# Patient Record
Sex: Female | Born: 1990 | Hispanic: Yes | Smoking: Never smoker
Health system: Southern US, Community
[De-identification: ages and names within clinical notes are randomized; demographics above are authoritative.]

## PROBLEM LIST (undated history)

## (undated) DIAGNOSIS — N939 Abnormal uterine and vaginal bleeding, unspecified: Secondary | ICD-10-CM

## (undated) DIAGNOSIS — Z789 Other specified health status: Secondary | ICD-10-CM

## (undated) DIAGNOSIS — O3110X0 Continuing pregnancy after spontaneous abortion of one fetus or more, unspecified trimester, not applicable or unspecified: Secondary | ICD-10-CM

## (undated) DIAGNOSIS — O4692 Antepartum hemorrhage, unspecified, second trimester: Secondary | ICD-10-CM

## (undated) DIAGNOSIS — O42919 Preterm premature rupture of membranes, unspecified as to length of time between rupture and onset of labor, unspecified trimester: Secondary | ICD-10-CM

## (undated) DIAGNOSIS — O0990 Supervision of high risk pregnancy, unspecified, unspecified trimester: Secondary | ICD-10-CM

## (undated) DIAGNOSIS — O34599 Maternal care for other abnormalities of gravid uterus, unspecified trimester: Secondary | ICD-10-CM

## (undated) DIAGNOSIS — N898 Other specified noninflammatory disorders of vagina: Secondary | ICD-10-CM

## (undated) DIAGNOSIS — O200 Threatened abortion: Secondary | ICD-10-CM

---
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
## 2011-08-10 DIAGNOSIS — O329XX0 Maternal care for malpresentation of fetus, unspecified, not applicable or unspecified: Secondary | ICD-10-CM

## 2018-11-14 NOTE — L&D Delivery Note (Addendum)
Patient called out and when RN arrived, head delivering, when MAU provider arrive delivery of non-viable female infant had occurred. I was called in. There was + heart rate > 100 and + fetal movement. Cord clamped x 2, cut. Baby wrapped in blanket and shown to mom and dad. Explained early nature of 20 wk fetus and incompatibility with life. Eyes were noted to be fused. She is well dated by 7 wk u/s. Given IV Pitocin and PR Cytotec 1000 mcg, and 20 u Pitocin injected into the cord. Awaiting delivery. It is unclear if they want autopsy or genetic testing Anesthesia: IV only

## 2019-06-06 ENCOUNTER — Inpatient Hospital Stay (HOSPITAL_COMMUNITY)
Admission: AD | Admit: 2019-06-06 | Discharge: 2019-06-06 | Disposition: A | Discharge status: Home / Self Care | Payer: Self-pay | Attending: Obstetrics & Gynecology | Admitting: Obstetrics & Gynecology

## 2019-06-06 ENCOUNTER — Inpatient Hospital Stay (HOSPITAL_COMMUNITY): Payer: Self-pay

## 2019-06-06 ENCOUNTER — Other Ambulatory Visit: Payer: Self-pay

## 2019-06-06 ENCOUNTER — Encounter (HOSPITAL_COMMUNITY): Payer: Self-pay

## 2019-06-06 DIAGNOSIS — N939 Abnormal uterine and vaginal bleeding, unspecified: Secondary | ICD-10-CM

## 2019-06-06 DIAGNOSIS — O3120X1 Continuing pregnancy after intrauterine death of one fetus or more, unspecified trimester, fetus 1: Secondary | ICD-10-CM

## 2019-06-06 DIAGNOSIS — O3121X1 Continuing pregnancy after intrauterine death of one fetus or more, first trimester, fetus 1: Secondary | ICD-10-CM

## 2019-06-06 DIAGNOSIS — Z3A01 Less than 8 weeks gestation of pregnancy: Secondary | ICD-10-CM | POA: Insufficient documentation

## 2019-06-06 DIAGNOSIS — N760 Acute vaginitis: Secondary | ICD-10-CM

## 2019-06-06 DIAGNOSIS — O209 Hemorrhage in early pregnancy, unspecified: Secondary | ICD-10-CM | POA: Insufficient documentation

## 2019-06-06 DIAGNOSIS — O23591 Infection of other part of genital tract in pregnancy, first trimester: Secondary | ICD-10-CM | POA: Insufficient documentation

## 2019-06-06 DIAGNOSIS — B9689 Other specified bacterial agents as the cause of diseases classified elsewhere: Secondary | ICD-10-CM | POA: Insufficient documentation

## 2019-06-06 DIAGNOSIS — O98811 Other maternal infectious and parasitic diseases complicating pregnancy, first trimester: Secondary | ICD-10-CM

## 2019-06-06 LAB — URINALYSIS, ROUTINE W REFLEX MICROSCOPIC
Bilirubin Urine: NEGATIVE
Glucose, UA: NEGATIVE mg/dL
Hgb urine dipstick: NEGATIVE
Ketones, ur: NEGATIVE mg/dL
Nitrite: POSITIVE — AB
Protein, ur: NEGATIVE mg/dL
Specific Gravity, Urine: 1.016 (ref 1.005–1.030)
pH: 6 (ref 5.0–8.0)

## 2019-06-06 LAB — CBC WITH DIFFERENTIAL/PLATELET
Abs Immature Granulocytes: 0.03 10*3/uL (ref 0.00–0.07)
Basophils Absolute: 0 10*3/uL (ref 0.0–0.1)
Basophils Relative: 0 %
Eosinophils Absolute: 0.1 10*3/uL (ref 0.0–0.5)
Eosinophils Relative: 2 %
HCT: 33.7 % — ABNORMAL LOW (ref 36.0–46.0)
Hemoglobin: 10.9 g/dL — ABNORMAL LOW (ref 12.0–15.0)
Immature Granulocytes: 0 %
Lymphocytes Relative: 21 %
Lymphs Abs: 1.7 10*3/uL (ref 0.7–4.0)
MCH: 26.9 pg (ref 26.0–34.0)
MCHC: 32.3 g/dL (ref 30.0–36.0)
MCV: 83.2 fL (ref 80.0–100.0)
Monocytes Absolute: 0.5 10*3/uL (ref 0.1–1.0)
Monocytes Relative: 6 %
Neutro Abs: 5.8 10*3/uL (ref 1.7–7.7)
Neutrophils Relative %: 71 %
Platelets: 259 10*3/uL (ref 150–400)
RBC: 4.05 MIL/uL (ref 3.87–5.11)
RDW: 14.2 % (ref 11.5–15.5)
WBC: 8.2 10*3/uL (ref 4.0–10.5)
nRBC: 0 % (ref 0.0–0.2)

## 2019-06-06 LAB — WET PREP, GENITAL
Sperm: NONE SEEN
Trich, Wet Prep: NONE SEEN
Yeast Wet Prep HPF POC: NONE SEEN

## 2019-06-06 LAB — HCG, QUANTITATIVE, PREGNANCY: hCG, Beta Chain, Quant, S: 84437 m[IU]/mL — ABNORMAL HIGH (ref <5)

## 2019-06-06 LAB — POCT PREGNANCY, URINE: Preg Test, Ur: POSITIVE — AB

## 2019-06-06 MED ORDER — METRONIDAZOLE 500 MG PO TABS: 500.0000 mg | ORAL_TABLET | Freq: Two times a day (BID) | ORAL | 0 refills | Status: DC

## 2019-06-06 MED ORDER — PREPLUS 27-1 MG PO TABS: 1.0000 | ORAL_TABLET | Freq: Every day | ORAL | 13 refills | Status: DC

## 2019-06-06 NOTE — MAU Note (Signed)
Pt c/o vaginal spotting yesterday after taking HPT a few days ago. Patient reports no pain.

## 2019-06-06 NOTE — MAU Provider Note (Signed)
History     CSN: [id number]  Arrival date and time: 06/06/19 1125   First Provider Initiated Contact with Patient 06/06/19 1223      Chief Complaint  Patient presents with  . Vaginal Bleeding   HPI Nichole Soto is a 28 y.o. G2P1001 at 9537w5d who presents to MAU with chief complaint of vaginal spotting in the setting of recent positive home pregnancy test. This is a new problem, onset yesterday. Patient endorses seeing blood on her toilet paper after voiding but denies ongoing vaginal bleeding. She denies abdominal pain, abdominal tenderness, dysuria, fever or recent illness.  OB History    Gravida  2   Para  1   Term  1   Preterm      AB      Living  1     SAB      TAB      Ectopic      Multiple      Live Births  1           Past Medical History:  Diagnosis Date  . Medical history non-contributory     Past Surgical History:  Procedure Laterality Date  . NO PAST SURGERIES      History reviewed. No pertinent family history.  Social History   Tobacco Use  . Smoking status: Not on file  Substance Use Topics  . Alcohol use: Not on file  . Drug use: Not on file    Allergies: No Known Allergies  No medications prior to admission.    Review of Systems  Constitutional: Negative for chills, fatigue and fever.  Respiratory: Negative for shortness of breath.   Gastrointestinal: Negative for abdominal pain.  Genitourinary: Positive for vaginal bleeding. Negative for difficulty urinating and dysuria.  Musculoskeletal: Negative for back pain.  Neurological: Negative for headaches.  All other systems reviewed and are negative.  Physical Exam   Blood pressure 115/60, pulse 77, temperature 98.6 F (37 C), temperature source Oral, resp. rate 18, weight 58.5 kg, last menstrual period 04/13/2019, SpO2 100 %.  Physical Exam  Nursing note and vitals reviewed. Constitutional: She is oriented to person, place, and time. She appears  well-developed and well-nourished.  Cardiovascular: Normal rate and normal pulses.  Respiratory: Effort normal.  GI: Soft. She exhibits no distension. There is no abdominal tenderness. There is no rebound, no guarding and no CVA tenderness.  Neurological: She is alert and oriented to person, place, and time.  Skin: Skin is warm and dry.  Psychiatric: She has a normal mood and affect. Her behavior is normal. Judgment and thought content normal.    MAU Course/MDM  Procedures  Patient Vitals for the past 24 hrs:  BP Temp Temp src Pulse Resp SpO2 Weight  06/06/19 1532 128/64 - - - - - -  06/06/19 1208 115/60 98.6 F (37 C) Oral 77 18 100 % -  06/06/19 1206 - - - - - - 58.5 kg    Results for orders placed or performed during the hospital encounter of 06/06/19 (from the past 24 hour(s))  Pregnancy, urine POC     Status: Abnormal   Collection Time: 06/06/19 11:56 AM  Result Value Ref Range   Preg Test, Ur POSITIVE (A) NEGATIVE  Urinalysis, Routine w reflex microscopic     Status: Abnormal   Collection Time: 06/06/19 12:14 PM  Result Value Ref Range   Color, Urine YELLOW YELLOW   APPearance HAZY (A) CLEAR   Specific Gravity, Urine  1.016 1.005 - 1.030   pH 6.0 5.0 - 8.0   Glucose, UA NEGATIVE NEGATIVE mg/dL   Hgb urine dipstick NEGATIVE NEGATIVE   Bilirubin Urine NEGATIVE NEGATIVE   Ketones, ur NEGATIVE NEGATIVE mg/dL   Protein, ur NEGATIVE NEGATIVE mg/dL   Nitrite POSITIVE (A) NEGATIVE   Leukocytes,Ua TRACE (A) NEGATIVE   RBC / HPF 0-5 0 - 5 RBC/hpf   WBC, UA 6-10 0 - 5 WBC/hpf   Bacteria, UA FEW (A) NONE SEEN   Squamous Epithelial / LPF 0-5 0 - 5   WBC Clumps PRESENT    Mucus PRESENT   CBC with Differential/Platelet     Status: Abnormal   Collection Time: 06/06/19 12:41 PM  Result Value Ref Range   WBC 8.2 4.0 - 10.5 K/uL   RBC 4.05 3.87 - 5.11 MIL/uL   Hemoglobin 10.9 (L) 12.0 - 15.0 g/dL   HCT 33.7 (L) 36.0 - 46.0 %   MCV 83.2 80.0 - 100.0 fL   MCH 26.9 26.0 - 34.0 pg    MCHC 32.3 30.0 - 36.0 g/dL   RDW 14.2 11.5 - 15.5 %   Platelets 259 150 - 400 K/uL   nRBC 0.0 0.0 - 0.2 %   Neutrophils Relative % 71 %   Neutro Abs 5.8 1.7 - 7.7 K/uL   Lymphocytes Relative 21 %   Lymphs Abs 1.7 0.7 - 4.0 K/uL   Monocytes Relative 6 %   Monocytes Absolute 0.5 0.1 - 1.0 K/uL   Eosinophils Relative 2 %   Eosinophils Absolute 0.1 0.0 - 0.5 K/uL   Basophils Relative 0 %   Basophils Absolute 0.0 0.0 - 0.1 K/uL   Immature Granulocytes 0 %   Abs Immature Granulocytes 0.03 0.00 - 0.07 K/uL  hCG, quantitative, pregnancy     Status: Abnormal   Collection Time: 06/06/19 12:41 PM  Result Value Ref Range   hCG, Beta Chain, Quant, S 84,437 (H) <5 mIU/mL  Wet prep, genital     Status: Abnormal   Collection Time: 06/06/19 12:43 PM   Specimen: Vaginal  Result Value Ref Range   Yeast Wet Prep HPF POC NONE SEEN NONE SEEN   Trich, Wet Prep NONE SEEN NONE SEEN   Clue Cells Wet Prep HPF POC PRESENT (A) NONE SEEN   WBC, Wet Prep HPF POC FEW (A) NONE SEEN   Sperm NONE SEEN    Meds ordered this encounter  Medications  . metroNIDAZOLE (FLAGYL) 500 MG tablet    Sig: Take 1 tablet (500 mg total) by mouth 2 (two) times daily.    Dispense:  14 tablet    Refill:  0    Order Specific Question:   Supervising Provider    Answer:   [place] Mode [[id number]]  . Prenatal Vit-Fe Fumarate-FA (PREPLUS) 27-1 MG TABS    Sig: Take 1 tablet by mouth daily.    Dispense:  30 tablet    Refill:  13    Order Specific Question:   Supervising Provider    Answer:   [place] Mode [[id number]]   US Ob Comp Addl Gest Less 14 Wks  Result Date: 06/06/2019 CLINICAL DATA:  New onset vaginal spotting yesterday, positive pregnancy test in MAU EXAM: TWIN OBSTETRIC <14WK [place] AND TRANSVAGINAL OB US COMPARISON:  None. FINDINGS: Number of IUPs:  2 Chorionicity/Amnionicity:  Dichorionic diamniotic TWIN 1  LEFT Yolk sac:  Present Embryo:  Present Cardiac Activity: Present Heart Rate: 146 bpm CRL:  11.4 mm   7  w 1 d                   US EDC: 01/22/2020 TWIN 2  RIGHT Markedly decreased size of gestational sac versus fetal pole size. Yolk sac:  Not visualized Embryo:  Present Cardiac Activity: Not visualized Heart Rate: N/A bpm CRL:  13.2 mm    w  d                  US EDC: Subchorionic hemorrhage:  None visualized. 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 Myometrium otherwise normal appearance. RIGHT ovary normal size and morphology, 2.1 x 2.0 x 3.2 cm. LEFT ovary normal size and morphology, 1.5 x 3.8 x 2.2 cm. No free pelvic fluid or adnexal masses. IMPRESSION: Twin intrauterine pregnancy. Twin 1 is live, located at LEFT cornua, EGA [redacted] weeks 1 day by crown-rump length. Twin 2 is nonviable, located at RIGHT cornua, with a fetal pole within a small contracted gestational sac and absent fetal cardiac activity. 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 Electronically Signed   By: Ulyses SouthwardMark  Boles M.D.   On: 06/06/2019 14:43   Assessment and Plan  --28 y.o. 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

## 2019-06-06 NOTE — MAU Note (Signed)
Still at registration desk with translator ( 2nd call)

## 2019-06-06 NOTE — Discharge Instructions (Signed)
Primer trimestre de embarazo  First Trimester of Pregnancy    El primer trimestre de embarazo se extiende desde la semana 1 hasta el final de la semana 13 (mes 1 al mes 3). Durante este tiempo, el bebé comenzará a desarrollarse dentro suyo. Entre la semana 6 y la 8, se forman los ojos y el rostro, y los latidos del corazón pueden escucharse en la ecografía. Al final de las 12 semanas, todos los órganos del bebé están formados. El cuidado prenatal es toda la asistencia médica que usted recibe antes del nacimiento del bebé. Asegúrese de recibir un buen cuidado prenatal y de seguir todas las indicaciones del médico.  Siga estas indicaciones en su casa:  Medicamentos  · Tome los medicamentos de venta libre y los recetados solamente como se lo haya indicado el médico. Algunos medicamentos son seguros para tomar durante el embarazo y otros no lo son.  · Tome vitaminas prenatales que contengan por lo menos 600 microgramos (?g) de ácido fólico.  · Si tiene problemas para defecar (estreñimiento), tome un medicamento que ablanda la materia fecal (laxante), siempre que lo autorice el médico.  Comida y bebida    · Ingiera alimentos saludables de manera regular.  · El médico le indicará la cantidad de peso que puede aumentar.  · No coma carne cruda ni quesos sin cocinar.  · Si tiene malestar estomacal (náuseas) o vomita (vómitos):  ? Ingiera 4 o 5 comidas pequeñas por día en lugar de 3 abundantes.  ? Intente comer algunas galletitas saladas.  ? Beba líquidos entre las comidas, en lugar de hacerlo durante estas.  · Para evitar el estreñimiento:  ? Consuma alimentos ricos en fibra, como frutas y verduras frescas, cereales integrales y legumbres.  ? Beba suficiente líquido para mantener el pis (orina) claro o de color amarillo pálido.  Actividad  · Haga ejercicios solamente como se lo haya indicado el médico. Deje de hacer ejercicios si tiene cólicos o dolor en la parte baja del vientre (abdomen) o en la cintura.  · No haga  actividad física si el clima está demasiado caluroso o húmedo, o si se encuentra en un lugar muy alto (altitud elevada).  · Intente no estar de pie durante mucho tiempo. Mueva las piernas con frecuencia si debe estar de pie en un lugar durante mucho tiempo.  · Evite levantar pesos excesivos.  · Use zapatos con tacones bajos. Mantenga una buena postura al sentarse y pararse.  · Puede tener relaciones sexuales, a menos que el médico le indique lo contrario.  Alivio del dolor y del malestar  · Use un sostén que le brinde buen soporte si le duelen las mamas.  · Dese baños de asiento con agua tibia para aliviar el dolor o las molestias causadas por las hemorroides. Use una crema antihemorroidal si el médico se lo permite.  · Descanse con las piernas elevadas si tiene calambres o dolor de cintura.  · Si tiene las venas de las piernas hinchadas y abultadas (venas varicosas):  ? Use medias elásticas de soporte o medias de compresión como se lo haya indicado el médico.  ? Levante (eleve) los pies durante 15 minutos, 3 o 4 veces por día.  ? Limite la sal en sus alimentos.  Cuidado prenatal  · Programe las visitas prenatales para la semana 12 de embarazo.  · Escriba sus preguntas. Llévelas cuando concurra a las visitas prenatales.  · Concurra a todas las visitas prenatales como se lo haya indicado el médico. Esto es importante.  Seguridad  · Use el cinturón de seguridad en todo   momento mientras conduce.  · Haga una lista con los números de teléfono en caso de emergencia. Esta lista debe incluir los números de los familiares, los amigos, el hospital y los departamentos de policía y de bomberos.  Instrucciones generales  · Pídale al médico que la derive a clases prenatales en su localidad. Debe comenzar a tomar las clases antes de entrar en el mes 6 de embarazo.  · Pida ayuda si necesita asesoramiento o asistencia con la alimentación. El médico puede aconsejarla o indicarle dónde recurrir para recibir ayuda.  · No se dé baños de  inmersión en agua caliente, baños turcos ni saunas.  · No se haga duchas vaginales ni use tampones o toallas higiénicas perfumadas.  · No mantenga las piernas cruzadas durante mucho tiempo.  · Evite las hierbas y el alcohol. Evite los fármacos que el médico no haya autorizado.  · No consuma ningún producto que contenga tabaco, lo que incluye cigarrillos, tabaco de mascar o cigarrillos electrónicos. Si necesita ayuda para dejar de fumar, consulte al médico. Puede recibir asesoramiento u otro tipo de apoyo para dejar de fumar.  · Evite el contacto con las bandejas sanitarias de los gatos y la tierra que estos animales usan. Estos elementos contienen gérmenes que pueden causar defectos congénitos al bebé y la posible pérdida del feto (aborto espontáneo) o muerte fetal.  · Visite al dentista. En su casa, lávese los dientes con un cepillo dental suave. Pásese el hilo dental con suavidad.  Comuníquese con un médico si:  · Tiene mareos.  · Tiene cólicos leves o siente presión en la parte baja del vientre.  · Sufre un dolor persistente en el abdomen.  · Sigue teniendo malestar estomacal, vomita o la materia fecal es líquida (diarrea).  · Nota una secreción de líquido con olor fétido que proviene de la vagina.  · Tiene dolor al hacer pis (orinar).  · Tiene el rostro, las manos, las piernas o los tobillos más hinchados (inflamados).  Solicite ayuda de inmediato si:  · Tiene fiebre.  · Tiene una pérdida de líquido por la vagina.  · Tiene sangrado o pequeñas pérdidas vaginales.  · Tiene cólicos o dolor muy intensos en el vientre.  · Sube o baja de peso rápidamente.  · Vomita sangre. Esto tiene un aspecto similar a la borra del café.  · Está en contacto con personas que tienen rubéola, la quinta enfermedad o varicela.  · Siente un dolor de cabeza muy intenso.  · Le falta el aire.  · Sufre cualquier tipo de traumatismo, por ejemplo, debido a una caída o un accidente automovilístico.  Resumen  · El primer trimestre de embarazo se  extiende desde la semana 1 hasta el final de la semana 13 (mes 1 al mes 3).  · Para cuidar su salud y la del bebé en gestación, necesitará consumir alimentos saludables, tomar medicamentos solamente si lo autoriza el médico, y hacer actividades que sean seguras para usted y para su bebé.  · Concurra a todas las visitas de control como se lo haya indicado el médico. Esto es importante porque el médico deberá asegurar que el bebé esté saludable y esté creciendo bien.  Esta información no tiene como fin reemplazar el consejo del médico. Asegúrese de hacerle al médico cualquier pregunta que tenga.  Document Released: 01/27/2009 Document Revised: 06/06/2017 Document Reviewed: 06/06/2017  Elsevier Patient Education © 2020 [employer]

## 2019-06-07 LAB — GC/CHLAMYDIA PROBE AMP (~~LOC~~) NOT AT ARMC
Chlamydia: NEGATIVE
Neisseria Gonorrhea: NEGATIVE

## 2019-06-23 ENCOUNTER — Encounter (HOSPITAL_COMMUNITY): Payer: Self-pay | Admitting: *Deleted

## 2019-06-23 ENCOUNTER — Inpatient Hospital Stay (HOSPITAL_COMMUNITY): Payer: Self-pay

## 2019-06-23 ENCOUNTER — Inpatient Hospital Stay (HOSPITAL_COMMUNITY)
Admission: AD | Admit: 2019-06-23 | Discharge: 2019-06-23 | Disposition: A | Discharge status: Home / Self Care | Payer: Self-pay | Attending: Obstetrics and Gynecology | Admitting: Obstetrics and Gynecology

## 2019-06-23 ENCOUNTER — Other Ambulatory Visit: Payer: Self-pay

## 2019-06-23 DIAGNOSIS — O468X1 Other antepartum hemorrhage, first trimester: Secondary | ICD-10-CM

## 2019-06-23 DIAGNOSIS — O208 Other hemorrhage in early pregnancy: Secondary | ICD-10-CM | POA: Insufficient documentation

## 2019-06-23 DIAGNOSIS — O209 Hemorrhage in early pregnancy, unspecified: Secondary | ICD-10-CM

## 2019-06-23 DIAGNOSIS — O30041 Twin pregnancy, dichorionic/diamniotic, first trimester: Secondary | ICD-10-CM | POA: Insufficient documentation

## 2019-06-23 DIAGNOSIS — Z3A10 10 weeks gestation of pregnancy: Secondary | ICD-10-CM | POA: Insufficient documentation

## 2019-06-23 DIAGNOSIS — O3121X2 Continuing pregnancy after intrauterine death of one fetus or more, first trimester, fetus 2: Secondary | ICD-10-CM

## 2019-06-23 DIAGNOSIS — O418X11 Other specified disorders of amniotic fluid and membranes, first trimester, fetus 1: Secondary | ICD-10-CM

## 2019-06-23 LAB — URINALYSIS, ROUTINE W REFLEX MICROSCOPIC
Bacteria, UA: NONE SEEN
Bilirubin Urine: NEGATIVE
Glucose, UA: NEGATIVE mg/dL
Hgb urine dipstick: NEGATIVE
Ketones, ur: NEGATIVE mg/dL
Nitrite: NEGATIVE
Protein, ur: NEGATIVE mg/dL
Specific Gravity, Urine: 1.025 (ref 1.005–1.030)
pH: 6 (ref 5.0–8.0)

## 2019-06-23 LAB — ABO/RH: ABO/RH(D): O POS

## 2019-06-23 NOTE — Discharge Instructions (Signed)
Hematoma subcoriónico °Subchorionic Hematoma ° °Un hematoma subcoriónico es una acumulación de sangre entre la pared externa del embrión (corion) y la pared interna de la matriz (útero). °Esta afección puede causar hemorragia vaginal. Si causan poca o nada de hemorragia vaginal, generalmente, los hematomas pequeños que ocurren al principio del embarazo se reducen por su propia cuenta y no afectan al bebé ni al embarazo. Cuando la hemorragia comienza más tarde en el embarazo, o el hematoma es más grande o se produce en una paciente de edad avanzada, la afección puede ser más grave. Los hematomas más grandes pueden agrandarse aún más, lo que aumenta las posibilidades de aborto espontáneo. Esta afección también aumenta los siguientes riesgos: °· Separación prematura de la placenta del útero. °· Parto antes de término (prematuro). °· Muerte fetal. °¿Cuáles son las causas? °Se desconoce la causa exacta de esta afección. Ocurre cuando la sangre queda atrapada entre la placenta y la pared uterina porque la placenta se ha separado del lugar original del implante. °¿Qué incrementa el riesgo? °Es más probable que desarrolle esta afección si: °· Recibió tratamiento con medicamentos para la fertilidad. °· La concepción se realizó a través de la fertilización in vitro (FIV). °¿Cuáles son los signos o los síntomas? °Los síntomas de esta afección incluyen los siguientes: °· Pérdida o hemorragia vaginal. °· Contracciones del útero. Estas contracciones provocan dolor abdominal. °En ocasiones, puede no haber síntomas y la hemorragia solo se puede ver cuando se toman imágenes ecográficas (ecografía transvaginal). °¿Cómo se diagnostica? °Esta afección se diagnostica con un examen físico. Es un examen pélvico. También pueden hacerle otros estudios, por ejemplo: °· Análisis de sangre. °· Análisis de orina. °· Ecografía del abdomen. °¿Cómo se trata? °El tratamiento de esta afección puede variar. El tratamiento puede incluir lo  siguiente: °· Observación cautelosa. La observarán atentamente para detectar cualquier cambio en la hemorragia. Durante esta etapa: °? El hematoma puede reabsorberse en el cuerpo. °? El hematoma puede separar el espacio lleno de líquido que contiene al embrión (saco gestacional) de la pared del útero (endometrio). °· Medicamentos. °· Restricción de las actividades. Puede ser necesaria hasta que se detenga la hemorragia. °Siga estas indicaciones en su casa: °· Haga reposo en cama si se lo indica el médico. °· No levante ningún objeto que pese más de 10 libras (4,5 kg) o siga las indicaciones del médico. °· No consuma ningún producto que contenga nicotina o tabaco, como cigarrillos y cigarrillos electrónicos. Si necesita ayuda para dejar de fumar, consulte al médico. °· Lleve un registro escrito de la cantidad de toallas higiénicas que utiliza cada día y cuán empapadas (saturadas) están. °· No use tampones. °· Concurra a todas las visitas de control como se lo haya indicado el médico. Esto es importante. El profesional podrá pedirle que se realice análisis de seguimiento, ecografías o ambas. °Comuníquese con un médico si: °· Tiene una hemorragia vaginal. °· Tiene fiebre. °Solicite ayuda de inmediato si: °· Siente calambres intensos en el estómago, en la espalda, en el abdomen o en la pelvis. °· Elimina coágulos o tejidos grandes. Guarde los tejidos para que su médico los vea. °· Tiene más hemorragia vaginal, y se desmaya o se siente mareada o débil. °Resumen °· Un hematoma subcoriónico es una acumulación de sangre entre la pared externa de la placenta y el útero. °· Esta afección puede causar hemorragia vaginal. °· En ocasiones, puede no haber síntomas y la hemorragia solo se puede ver cuando se toman imágenes ecográficas. °· El tratamiento puede incluir una observación cautelosa, medicamentos   o restricción de las actividades. °Esta información no tiene como fin reemplazar el consejo del médico. Asegúrese de hacerle  al médico cualquier pregunta que tenga. °Document Released: 02/16/2009 Document Revised: 08/10/2017 Document Reviewed: 08/10/2017 °Elsevier Patient Education © 2020 [employer]vier Inc. ° °

## 2019-06-23 NOTE — MAU Note (Signed)
Nichole Soto is a 28 y.o. at [redacted]w[redacted]d here in MAU reporting: is here to see if babies are okay, states with previous u/s it showed 1 baby was okay but they other may not be. Is having some spotting but no pain. Has been spotting since last visit. No abnormal discharge.  Onset of complaint: ongoing  Pain score: 0/10  Vitals:   06/23/19 1231  BP: 105/63  Pulse: 64  Resp: 16  Temp: 98.6 F (37 C)  SpO2: 100%      Lab orders placed from triage: UA

## 2019-06-23 NOTE — MAU Provider Note (Signed)
Chief Complaint: Vaginal Bleeding   First Provider Initiated Contact with Patient 06/23/19 1257     *Spanish interpreter used for this visit*  SUBJECTIVE HPI: Nichole Soto is a 28 y.o. G2P1001 at 8020w1d who presents to Maternity Admissions reporting vaginal bleeding. Has had consistent pink spotting since her last MAU visit. Only sees bleeding when she wipes. Denies abdominal pain, vaginal discharge, or dysuria. Has not started prenatal care yet. IUP was confirmed during last MAU visit. Ultrasound showed twin gestation, 1 live & 1 non viable. 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    Past Medical History:  Diagnosis Date  . Medical history non-contributory    OB History  Gravida Para Term Preterm AB Living  2 1 1     1   SAB TAB Ectopic Multiple Live Births          1    # Outcome Date GA Lbr Len/2nd Weight Sex Delivery Anes PTL Lv  2 Current           1 Term 08/10/11    Wandalee FerdinandM CS-LTranv   LIV   Past Surgical History:  Procedure Laterality Date  . CESAREAN SECTION     Social History   Socioeconomic History  . Marital status: Significant Other    Spouse name: Not on file  . Number of children: Not on file  . Years of education: Not on file  . Highest education level: Not on file  Occupational History  . Not on file  Social Needs  . Financial resource strain: Not on file  . Food insecurity    Worry: Not on file    Inability: Not on file  . Transportation needs    Medical: Not on file    Non-medical: Not on file  Tobacco Use  . Smoking status: Never Smoker  . Smokeless tobacco: Never Used  Substance and Sexual Activity  . Alcohol use: Not Currently  . Drug use: Never  . Sexual activity: Yes    Birth control/protection: None  Lifestyle  . Physical activity    Days per week: Not on file    Minutes per session: Not on file  . Stress: Not on file  Relationships  . Social Musicianconnections    Talks on phone: Not on file    Gets together: Not on  file    Attends religious service: Not on file    Active member of club or organization: Not on file    Attends meetings of clubs or organizations: Not on file    Relationship status: Not on file  . Intimate partner violence    Fear of current or ex partner: Not on file    Emotionally abused: Not on file    Physically abused: Not on file    Forced sexual activity: Not on file  Other Topics Concern  . Not on file  Social History Narrative  . Not on file   History reviewed. No pertinent family history. No current facility-administered medications on file prior to encounter.    Current Outpatient Medications on File Prior to Encounter  Medication Sig Dispense Refill  . Prenatal Vit-Fe Fumarate-FA (PREPLUS) 27-1 MG TABS Take 1 tablet by mouth daily. 30 tablet 13   No Known Allergies  I have reviewed patient's Past Medical Hx, Surgical Hx, Family Hx, Social Hx, medications and allergies.   Review of Systems  Constitutional: Negative.   Gastrointestinal: Negative.   Genitourinary: Positive for vaginal bleeding. Negative for dysuria  and vaginal discharge.    OBJECTIVE Patient Vitals for the past 24 hrs:  BP Temp Temp src Pulse Resp SpO2 Height Weight  06/23/19 1539 103/65 - - 65 16 100 % - -  06/23/19 1231 105/63 98.6 F (37 C) Oral 64 16 100 % - -  06/23/19 1228 - - - - - - 4' 10.5" (1.486 m) 56.8 kg   Constitutional: Well-developed, well-nourished female in no acute distress.  Cardiovascular: normal rate & rhythm, no murmur Respiratory: normal rate and effort. Lung sounds clear throughout GI: Abd soft, non-tender, Pos BS x 4. No guarding or rebound tenderness MS: Extremities nontender, no edema, normal ROM Neurologic: Alert and oriented x 4.      LAB RESULTS Results for orders placed or performed during the hospital encounter of 06/23/19 (from the past 24 hour(s))  Urinalysis, Routine w reflex microscopic     Status: Abnormal   Collection Time: 06/23/19 12:32 PM  Result  Value Ref Range   Color, Urine AMBER (A) YELLOW   APPearance HAZY (A) CLEAR   Specific Gravity, Urine 1.025 1.005 - 1.030   pH 6.0 5.0 - 8.0   Glucose, UA NEGATIVE NEGATIVE mg/dL   Hgb urine dipstick NEGATIVE NEGATIVE   Bilirubin Urine NEGATIVE NEGATIVE   Ketones, ur NEGATIVE NEGATIVE mg/dL   Protein, ur NEGATIVE NEGATIVE mg/dL   Nitrite NEGATIVE NEGATIVE   Leukocytes,Ua TRACE (A) NEGATIVE   WBC, UA 0-5 0 - 5 WBC/hpf   Bacteria, UA NONE SEEN NONE SEEN   Squamous Epithelial / LPF 0-5 0 - 5   Mucus PRESENT    Uric Acid Crys, UA PRESENT   ABO/Rh     Status: None   Collection Time: 06/23/19  1:47 PM  Result Value Ref Range   ABO/RH(D) O POS    No rh immune globuloin      NOT A RH IMMUNE GLOBULIN CANDIDATE, PT RH POSITIVE Performed at [facility], [address] [place], [place] [address]     IMAGING [place] Ob Comp Less 14 Wks  Result Date: 06/23/2019 CLINICAL DATA:  Vaginal bleeding. 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 Twin 1 is located within the right uterine horn. Twin 2 is located within the left uterine horn. TWIN 1 Yolk sac:  Visualized. Embryo:  Visualized. Cardiac Activity: Visualized. Heart Rate: 162 bpm CRL: 28.1 mm   9 w 4 d                  US EDC: 01/22/2020 TWIN 2 Yolk sac:  Questionable Embryo:  Visualized. Cardiac Activity: Not Visualized. CRL:   7.4 mm   6 w 4 d Subchorionic hemorrhage: There is a small subchorionic hemorrhage around the gestational sac of Twin 1. Maternal uterus/adnexae: Normal appearance of the ovaries. No free fluid seen. 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 Live intrauterine gestation corresponding to 9 weeks and 4 days is located within the right uterine horn. There is a small subchorionic hemorrhage. Nonviable intrauterine gestation with contracted gestational sac and absent  fetal cardiac activity is located within the left uterine horn. Electronically Signed   By: Ted Mcalpineobrinka  Dimitrova M.D.   On: 06/23/2019 15:23   [place] Ob Comp Addl Gest Less 14 Wks  Result Date: 06/23/2019 CLINICAL DATA:  Vaginal bleeding. 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 Twin 1 is located within the right uterine horn. Twin 2 is located within the left uterine horn. TWIN 1 Yolk sac:  Visualized. Embryo:  Visualized. Cardiac Activity: Visualized. Heart Rate: 162 bpm CRL: 28.1 mm   9 w 4 d                  [place] EDC: 01/22/2020 TWIN 2 Yolk sac:  Questionable Embryo:  Visualized. Cardiac Activity: Not Visualized. CRL:   7.4 mm   6 w 4 d Subchorionic hemorrhage: There is a small subchorionic hemorrhage around the gestational sac of Twin 1. Maternal uterus/adnexae: Normal appearance of the ovaries. No free fluid seen. 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 Live intrauterine gestation corresponding to 9 weeks and 4 days is located within the right uterine horn. There is a small subchorionic hemorrhage. Nonviable intrauterine gestation with contracted gestational sac and absent fetal cardiac activity is located within the left uterine horn. Electronically Signed   By: Fidela Salisbury M.D.   On: 06/23/2019 15:23    MAU COURSE Orders Placed This Encounter  Procedures  . US OB Comp Less 14 Wks  . US OB Comp AddL Gest Less 14 Wks  . Urinalysis, Routine w reflex microscopic  . ABO/Rh  . Discharge patient   No orders of the defined types were placed in this encounter.   MDM Abo/rh not on file so drawn today. RH positive.   Reviewed previous ultrasound study. Ultrasound repeated today. Baby A has had appropriate growth & continues to have cardiac activity. Baby B has had no growth & no heartbeat. Small [place] around baby A. 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  Discussed results with patient via [place] interpreter. Patient is scheduling prenatal care at an office in [facility].  ASSESSMENT 1. Subchorionic hematoma in first trimester, fetus 1 of multiple gestation   2. Vaginal bleeding in pregnancy, first trimester   3. Twin pregnancy with single intrauterine death in first trimester, fetus 2 of multiple gestation     PLAN Discharge home in stable condition. SAB precautions Start prenatal care  Follow-up Information    [facility] Follow up.   Specialty: Obstetrics and Gynecology Why: return for worsening symptoms Contact information: [address] [id number] [place] [phone]         Allergies as of 06/23/2019   No Known Allergies     Medication List    STOP taking these medications   metroNIDAZOLE 500 MG tablet Commonly known as: Flagyl     TAKE these medications   PrePLUS 27-1 MG Tabs Take 1 tablet by mouth daily.        Jorje Guild, NP 06/23/2019  4:59 PM

## 2019-07-11 ENCOUNTER — Emergency Department (HOSPITAL_COMMUNITY)
Admission: EM | Admit: 2019-07-11 | Discharge: 2019-07-12 | Disposition: A | Discharge status: Home / Self Care | Payer: Self-pay | Attending: Emergency Medicine | Admitting: Emergency Medicine

## 2019-07-11 ENCOUNTER — Encounter (HOSPITAL_COMMUNITY): Payer: Self-pay

## 2019-07-11 ENCOUNTER — Other Ambulatory Visit: Payer: Self-pay

## 2019-07-11 DIAGNOSIS — O9989 Other specified diseases and conditions complicating pregnancy, childbirth and the puerperium: Secondary | ICD-10-CM | POA: Insufficient documentation

## 2019-07-11 DIAGNOSIS — R42 Dizziness and giddiness: Secondary | ICD-10-CM | POA: Insufficient documentation

## 2019-07-11 DIAGNOSIS — O210 Mild hyperemesis gravidarum: Secondary | ICD-10-CM

## 2019-07-11 DIAGNOSIS — R079 Chest pain, unspecified: Secondary | ICD-10-CM | POA: Insufficient documentation

## 2019-07-11 DIAGNOSIS — Z3A12 12 weeks gestation of pregnancy: Secondary | ICD-10-CM | POA: Insufficient documentation

## 2019-07-11 LAB — CBC
HCT: 34.2 % — ABNORMAL LOW (ref 36.0–46.0)
Hemoglobin: 11.1 g/dL — ABNORMAL LOW (ref 12.0–15.0)
MCH: 28 pg (ref 26.0–34.0)
MCHC: 32.5 g/dL (ref 30.0–36.0)
MCV: 86.1 fL (ref 80.0–100.0)
Platelets: 240 10*3/uL (ref 150–400)
RBC: 3.97 MIL/uL (ref 3.87–5.11)
RDW: 14.7 % (ref 11.5–15.5)
WBC: 7.9 10*3/uL (ref 4.0–10.5)
nRBC: 0 % (ref 0.0–0.2)

## 2019-07-11 LAB — BASIC METABOLIC PANEL
Anion gap: 10 (ref 5–15)
BUN: <5 mg/dL — ABNORMAL LOW (ref 6–20)
CO2: 19 mmol/L — ABNORMAL LOW (ref 22–32)
Calcium: 9.1 mg/dL (ref 8.9–10.3)
Chloride: 104 mmol/L (ref 98–111)
Creatinine, Ser: 0.36 mg/dL — ABNORMAL LOW (ref 0.44–1.00)
GFR calc Af Amer: >60 mL/min (ref >60)
GFR calc non Af Amer: >60 mL/min (ref >60)
Glucose, Bld: 102 mg/dL — ABNORMAL HIGH (ref 70–99)
Potassium: 3.6 mmol/L (ref 3.5–5.1)
Sodium: 133 mmol/L — ABNORMAL LOW (ref 135–145)

## 2019-07-11 LAB — I-STAT BETA HCG BLOOD, ED (MC, WL, AP ONLY): I-stat hCG, quantitative: >2000 m[IU]/mL — ABNORMAL HIGH (ref <5)

## 2019-07-11 LAB — TROPONIN I (HIGH SENSITIVITY)
Troponin I (High Sensitivity): 5 ng/L (ref <18)
Troponin I (High Sensitivity): <2 ng/L (ref <18)

## 2019-07-11 LAB — LIPASE, BLOOD: Lipase: 29 U/L (ref 11–51)

## 2019-07-11 NOTE — ED Triage Notes (Signed)
Pt arrives POV for eval of dizziness, abd cramping and SOB. Pt reports this started all of a sudden. Denies vag bleeeding, loss of fluids or complications w/ pregnancy

## 2019-07-12 ENCOUNTER — Other Ambulatory Visit: Payer: Self-pay

## 2019-07-12 LAB — URINALYSIS, ROUTINE W REFLEX MICROSCOPIC
Bilirubin Urine: NEGATIVE
Glucose, UA: NEGATIVE mg/dL
Hgb urine dipstick: NEGATIVE
Ketones, ur: NEGATIVE mg/dL
Leukocytes,Ua: NEGATIVE
Nitrite: NEGATIVE
Protein, ur: NEGATIVE mg/dL
Specific Gravity, Urine: 1.006 (ref 1.005–1.030)
pH: 6 (ref 5.0–8.0)

## 2019-07-12 MED ORDER — SODIUM CHLORIDE 0.9 % IV BOLUS
1000.0000 mL | Freq: Once | INTRAVENOUS | Status: AC
  Administered 2019-07-12: 1000 mL via INTRAVENOUS

## 2019-07-12 MED ORDER — VITAMIN B-6 25 MG PO TABS: 25.0000 mg | ORAL_TABLET | Freq: Two times a day (BID) | ORAL | 0 refills | Status: AC | PRN

## 2019-07-12 MED ORDER — DOXYLAMINE SUCCINATE (SLEEP) 25 MG PO TABS: 25.0000 mg | ORAL_TABLET | Freq: Every evening | ORAL | 0 refills | Status: DC | PRN

## 2019-07-12 NOTE — ED Provider Notes (Signed)
[facility] EMERGENCY DEPARTMENT Provider Note  CSN: [id number] Arrival date & time: 07/11/19 1821  Chief Complaint(s) Dizziness and Chest Pain  HPI Nichole Soto is a 28 y.o. female g2p1 at [redacted]w[redacted]d, pregnant with twins, which one was nonviable is here for intermittent lightheadedness and dyspnea. Symptoms have been ongoing for several weeks.  Today's episode began approximately 6 to 10 hours ago.  Symptom onset was gradual.  Resolved prior to arrival.  Patient endorses symptoms of morning sickness.  She did endorse chest discomfort associated with the nausea and vomiting.  Currently chest pain-free.  She is denying any abdominal pain.  She does report intermittent abdominal cramping.  No urinary symptoms.  No fevers or recent infections.  Denies any other physical complaints.  HPI  Past Medical History Past Medical History:  Diagnosis Date  . Medical history non-contributory    There are no active problems to display for this patient.  Home Medication(s) Prior to Admission medications   Medication Sig Start Date End Date Taking? Authorizing Provider  doxylamine, Sleep, (UNISOM) 25 MG tablet Take 1 tablet (25 mg total) by mouth at bedtime as needed. 07/12/19 08/11/19  Fatima Blank, MD  Prenatal Vit-Fe Fumarate-FA (PREPLUS) 27-1 MG TABS Take 1 tablet by mouth daily. 06/06/19   Darlina Rumpf, CNM  vitamin B-6 (PYRIDOXINE) 25 MG tablet Take 1 tablet (25 mg total) by mouth 2 (two) times daily as needed. 07/12/19 08/11/19  Fatima Blank, MD                                                                                                                                    Past Surgical History Past Surgical History:  Procedure Laterality Date  . CESAREAN SECTION     Family History History reviewed. No pertinent family history.  Social History Social History   Tobacco Use  . Smoking status: Never Smoker  . Smokeless tobacco: Never  Used  Substance Use Topics  . Alcohol use: Not Currently  . Drug use: Never   Allergies Patient has no known allergies.  Review of Systems Review of Systems All other systems are reviewed and are negative for acute change except as noted in the HPI  Physical Exam Vital Signs  I have reviewed the triage vital signs BP 105/66   Pulse 76   Temp 98.6 F (37 C) (Oral)   Resp 17   Ht 4\' 11"  (1.499 m)   Wt 62.1 kg   LMP 04/13/2019 (Approximate)   SpO2 100%   BMI 27.67 kg/m   Physical Exam Vitals signs reviewed.  Constitutional:      General: She is not in acute distress.    Appearance: She is well-developed. She is not diaphoretic.  HENT:     Head: Normocephalic and atraumatic.     Right Ear: External ear normal.     Left Ear: External ear normal.  Nose: Nose normal.  Eyes:     General: No scleral icterus.    Conjunctiva/sclera: Conjunctivae normal.  Neck:     Musculoskeletal: Normal range of motion.     Trachea: Phonation normal.  Cardiovascular:     Rate and Rhythm: Normal rate and regular rhythm.  Pulmonary:     Effort: Pulmonary effort is normal. No respiratory distress.     Breath sounds: No stridor.  Abdominal:     General: There is no distension.     Tenderness: There is no abdominal tenderness.  Musculoskeletal: Normal range of motion.  Neurological:     Mental Status: She is alert and oriented to person, place, and time.  Psychiatric:        Behavior: Behavior normal.     ED Results and Treatments Labs (all labs ordered are listed, but only abnormal results are displayed) Labs Reviewed  BASIC METABOLIC PANEL - Abnormal; Notable for the following components:      Result Value   Sodium 133 (*)    CO2 19 (*)    Glucose, Bld 102 (*)    BUN <5 (*)    Creatinine, Ser 0.36 (*)    All other components within normal limits  CBC - Abnormal; Notable for the following components:   Hemoglobin 11.1 (*)    HCT 34.2 (*)    All other components within  normal limits  URINALYSIS, ROUTINE W REFLEX MICROSCOPIC - Abnormal; Notable for the following components:   Color, Urine STRAW (*)    All other components within normal limits  I-STAT BETA HCG BLOOD, ED ([facility], [facility], [facility] ONLY) - Abnormal; Notable for the following components:   I-stat hCG, quantitative >2,000.0 (*)    All other components within normal limits  LIPASE, BLOOD  TROPONIN I (HIGH SENSITIVITY)  TROPONIN I (HIGH SENSITIVITY)                                                                                                                         EKG  EKG Interpretation  Date/Time:  Thursday July 11 2019 23:22:04 EDT Ventricular Rate:  76 PR Interval:    QRS Duration: 88 QT Interval:  373 QTC Calculation: 420 R Axis:   76 Text Interpretation:  Sinus rhythm No significant change since last tracing Confirmed by Drema Pryardama,  [phone]([id number]) on 07/11/2019 11:36:27 PM      Radiology No results found.  Pertinent labs & imaging results that were available during my care of the patient were reviewed by me and considered in my medical decision making (see chart for details).  Medications Ordered in ED Medications  sodium chloride 0.9 % bolus 1,000 mL (1,000 mLs Intravenous New Bag/Given 07/12/19 0203)  Procedures Procedures  (including critical care time)  Medical Decision Making / ED Course I have reviewed the nursing notes for this encounter and the patient's prior records (if available in EHR or on provided paperwork).   Nichole Soto was evaluated in Emergency Department on 07/12/2019 for the symptoms described in the history of present illness. She was evaluated in the context of the global COVID-19 pandemic, which necessitated consideration that the patient might be at risk for infection with the SARS-CoV-2 virus that causes COVID-19.  Institutional protocols and algorithms that pertain to the evaluation of patients at risk for COVID-19 are in a state of rapid change based on information released by regulatory bodies including the CDC and federal and state organizations. These policies and algorithms were followed during the patient's care in the ED.  Patient presents with lightheadedness in the setting of pregnancy and morning sickness.  Endorsed associated chest discomfort but currently chest pain-free.  Patient is not short of breath at this time.  Labs are grossly reassuring without leukocytosis.  Stable hemoglobin.  No significant electrolyte derangements or renal sufficiency.  EKG is nonischemic and serial troponins negative.  Doubt pulmonary embolism.  No coughing and congestion concerning for pneumonia backslash process.  Provided with IV fluids resulting in significant improvement and resolution of her lightheadedness.  Likely dehydration from hyperemesis gravidarum.  Has tolerated oral intake here in the emergency department.  The patient is safe for discharge with strict return precautions.       Final Clinical Impression(s) / ED Diagnoses Final diagnoses:  None    The patient appears reasonably screened and/or stabilized for discharge and I doubt any other medical condition or other [facility] requiring further screening, evaluation, or treatment in the ED at this time prior to discharge.  Disposition: Discharge  Condition: Good  I have discussed the results, Dx and Tx plan with the patient who expressed understanding and agree(s) with the plan. Discharge instructions discussed at great length. The patient was given strict return precautions who verbalized understanding of the instructions. No further questions at time of discharge.    ED Discharge Orders         Ordered    vitamin B-6 (PYRIDOXINE) 25 MG tablet  2 times daily PRN     07/12/19 0302    doxylamine, Sleep, (UNISOM) 25 MG tablet  At bedtime PRN      07/12/19 0302           Follow Up: obstetrician   as scheduled      This chart was dictated using voice recognition software.  Despite best efforts to proofread,  errors can occur which can change the documentation meaning.   Nira Conn, MD 07/12/19 [phone]

## 2019-07-12 NOTE — ED Notes (Signed)
FHTs assessed via doppler, HR 155-165. MD aware

## 2019-07-29 ENCOUNTER — Encounter: Payer: Self-pay | Admitting: Obstetrics and Gynecology

## 2019-07-29 ENCOUNTER — Other Ambulatory Visit: Payer: Self-pay

## 2019-07-29 ENCOUNTER — Ambulatory Visit (INDEPENDENT_AMBULATORY_CARE_PROVIDER_SITE_OTHER): Payer: Self-pay | Admitting: Obstetrics and Gynecology

## 2019-07-29 ENCOUNTER — Ambulatory Visit: Payer: Self-pay

## 2019-07-29 VITALS — BP 104/60 | HR 85 | Temp 98.5°F | Wt 126.7 lb

## 2019-07-29 DIAGNOSIS — O34592 Maternal care for other abnormalities of gravid uterus, second trimester: Secondary | ICD-10-CM

## 2019-07-29 DIAGNOSIS — Z789 Other specified health status: Secondary | ICD-10-CM | POA: Insufficient documentation

## 2019-07-29 DIAGNOSIS — O3110X2 Continuing pregnancy after spontaneous abortion of one fetus or more, unspecified trimester, fetus 2: Secondary | ICD-10-CM

## 2019-07-29 DIAGNOSIS — Z98891 History of uterine scar from previous surgery: Secondary | ICD-10-CM

## 2019-07-29 DIAGNOSIS — N898 Other specified noninflammatory disorders of vagina: Secondary | ICD-10-CM

## 2019-07-29 DIAGNOSIS — O3110X0 Continuing pregnancy after spontaneous abortion of one fetus or more, unspecified trimester, not applicable or unspecified: Secondary | ICD-10-CM

## 2019-07-29 DIAGNOSIS — Z113 Encounter for screening for infections with a predominantly sexual mode of transmission: Secondary | ICD-10-CM

## 2019-07-29 DIAGNOSIS — N939 Abnormal uterine and vaginal bleeding, unspecified: Secondary | ICD-10-CM

## 2019-07-29 DIAGNOSIS — O329XX0 Maternal care for malpresentation of fetus, unspecified, not applicable or unspecified: Secondary | ICD-10-CM

## 2019-07-29 DIAGNOSIS — O0990 Supervision of high risk pregnancy, unspecified, unspecified trimester: Secondary | ICD-10-CM | POA: Insufficient documentation

## 2019-07-29 DIAGNOSIS — O34599 Maternal care for other abnormalities of gravid uterus, unspecified trimester: Secondary | ICD-10-CM

## 2019-07-29 DIAGNOSIS — B373 Candidiasis of vulva and vagina: Secondary | ICD-10-CM

## 2019-07-29 DIAGNOSIS — Z3A15 15 weeks gestation of pregnancy: Secondary | ICD-10-CM

## 2019-07-29 DIAGNOSIS — O0992 Supervision of high risk pregnancy, unspecified, second trimester: Secondary | ICD-10-CM

## 2019-07-29 DIAGNOSIS — O200 Threatened abortion: Secondary | ICD-10-CM

## 2019-07-29 HISTORY — DX: Maternal care for other abnormalities of gravid uterus, unspecified trimester: O34.599

## 2019-07-29 HISTORY — DX: Continuing pregnancy after spontaneous abortion of one fetus or more, unspecified trimester, not applicable or unspecified: O31.10X0

## 2019-07-29 NOTE — Progress Notes (Signed)
Pt states yesterday she had fluid w/ some blood, now when she wipes she see's light spotting.

## 2019-07-29 NOTE — Progress Notes (Signed)
Pt informed that the ultrasound is considered a limited OB ultrasound and is not intended to be a complete ultrasound exam.  Patient also informed that the ultrasound is not being completed with the intent of assessing for fetal or placental anomalies or any pelvic abnormalities.  Explained that the purpose of today's ultrasound is to assess for amniotic fluid volume.  Patient acknowledges the purpose of the exam and the limitations of the study.

## 2019-07-30 ENCOUNTER — Encounter: Payer: Self-pay | Admitting: Obstetrics and Gynecology

## 2019-07-30 ENCOUNTER — Telehealth: Payer: Self-pay

## 2019-07-30 DIAGNOSIS — Z98891 History of uterine scar from previous surgery: Secondary | ICD-10-CM

## 2019-07-30 DIAGNOSIS — N898 Other specified noninflammatory disorders of vagina: Secondary | ICD-10-CM | POA: Insufficient documentation

## 2019-07-30 DIAGNOSIS — N939 Abnormal uterine and vaginal bleeding, unspecified: Secondary | ICD-10-CM | POA: Insufficient documentation

## 2019-07-30 DIAGNOSIS — O329XX0 Maternal care for malpresentation of fetus, unspecified, not applicable or unspecified: Secondary | ICD-10-CM | POA: Insufficient documentation

## 2019-07-30 DIAGNOSIS — O200 Threatened abortion: Secondary | ICD-10-CM | POA: Insufficient documentation

## 2019-07-30 HISTORY — DX: History of uterine scar from previous surgery: Z98.891

## 2019-07-30 NOTE — Progress Notes (Signed)
New OB Note  07/29/2019   Clinic: Center for [facility]  Chief Complaint: NOB  Transfer of Care Patient: yes  History of Present Illness: Ms. Earlie Lou is a 28 y.o. G2P1001 @ 15/2 weeks (EDC 3/6, based on Patient's last menstrual period was 04/13/2019 (approximate).=7wk u/s).  Preg complicated by has Abnormal shape or position of gravid uterus and adnexa, antepartum; Supervision of high risk pregnancy, antepartum; Vanishing twin syndrome; and Language barrier on their problem list.   Any events prior to today's visit: dx with embryonic demise of twin in left horn She has Negative signs or symptoms of nausea/vomiting of pregnancy. She has yes signs or symptoms of miscarriage or preterm labor. For past few days she's had some vag discharge and spotting  ROS: A 12-point review of systems was performed and negative, except as stated in the above HPI.  OBGYN History: As per HPI. OB History  Gravida Para Term Preterm AB Living  2 1 1     1   SAB TAB Ectopic Multiple Live Births          1    # Outcome Date GA Lbr Len/2nd Weight Sex Delivery Anes PTL Lv  2 Current           1 Term 08/10/11    M CS-Unspec   LIV     Complications: Malpresentation of fetus    Obstetric Comments  G1: 2012, term c/s for transverse. Pt never told she couldn't labor in the future.    Prior children are healthy, doing well, and without any problems or issues: yes History of pap smears: unsure.    Past Medical History: Past Medical History:  Diagnosis Date  . Medical history non-contributory     Past Surgical History: Past Surgical History:  Procedure Laterality Date  . CESAREAN SECTION      Family History:  None  Social History:  Social History   Socioeconomic History  . Marital status: Significant Other    Spouse name: Not on file  . Number of children: Not on file  . Years of education: Not on file  . Highest education level: Not on file  Occupational History  . Not on file   Social Needs  . Financial resource strain: Not on file  . Food insecurity    Worry: Sometimes true    Inability: Sometimes true  . Transportation needs    Medical: Yes    Non-medical: Yes  Tobacco Use  . Smoking status: Never Smoker  . Smokeless tobacco: Never Used  Substance and Sexual Activity  . Alcohol use: Not Currently  . Drug use: Never  . Sexual activity: Yes    Birth control/protection: None  Lifestyle  . Physical activity    Days per week: Not on file    Minutes per session: Not on file  . Stress: Not on file  Relationships  . Social Herbalist on phone: Not on file    Gets together: Not on file    Attends religious service: Not on file    Active member of club or organization: Not on file    Attends meetings of clubs or organizations: Not on file    Relationship status: Not on file  . Intimate partner violence    Fear of current or ex partner: Not on file    Emotionally abused: Not on file    Physically abused: Not on file    Forced sexual activity: Not on file  Other  Topics Concern  . Not on file  Social History Narrative  . Not on file    Allergy: No Known Allergies  Health Maintenance:  Mammogram Up to Date: not applicable  Current Outpatient Medications: PNV  Physical Exam:   BP 104/60   Pulse 85   Temp 98.5 F (36.9 C)   Wt 126 lb 11.2 oz (57.5 kg)   LMP 04/13/2019 (Approximate)   BMI 25.59 kg/m  Body mass index is 25.59 kg/m. Contractions: Not present Vag. Bleeding: Scant. Fundal height: not applicable FHTs: 150s  General appearance: Well nourished, well developed female in no acute distress.  Neck:  Supple, normal appearance, and no thyromegaly  Cardiovascular: S1, S2 normal, no murmur, rub or gallop, regular rate and rhythm Respiratory:  Clear to auscultation bilateral. Normal respiratory effort Abdomen: gravid, well healed vertical midline skin incision. positive bowel sounds and no masses, hernias; diffusely non  tender to palpation, non distended Breasts: breasts appear normal, no suspicious masses, no skin or nipple changes or axillary nodes, and normal palpation. Neuro/Psych:  Normal mood and affect.  Skin:  Warm and dry.  Lymphatic:  No inguinal lymphadenopathy.   Pelvic exam: is not limited by body habitus EGBUS: within normal limits, Vagina: scant old blood and d/c in the vault, no active bleeding Cervix: normal appearing cervix without discharge or lesions, closed/long/high, Uterus:  enlarged, c/w 16 week size, and Adnexa:  normal adnexa and no mass, fullness, tenderness  Laboratory: Patient states she had bloodwork and a [facility] exam at [facility]. No records are scanned in or in the mail stack  Imaging:  Bedside u/s done: normal AF, FHR and +FM on u/s (see formal report)  Assessment: pt stable. Threatened AB.   Plan: 1. Supervision of high risk pregnancy, antepartum Routine care.  Unable to offer genetics due to h/o vanishing twin Obt[facility] labs, etc from [facility].  Anatomy u/s ordered.  - Cervicovaginal ancillary only( [place]) - US MFM OB DETAIL +14 WK; Future - US OB Limited; Future  2. Vaginal discharge See below  3. Vaginal spotting  4. Abnormal shape or position of gravid uterus and adnexa, antepartum D/w her that could be why she had malpresentation in her 2012 pregnancy I also d/w her increased risk of PTL with her uterus (septate vs bicornuate).   5. Language barrier Interpreter used  6. Vanishing twin syndrome See above. D/w her that it does put her at risk for miscarriage Unsure etiology for VB. No e/o rupture or cx insufficiency Recommend qwk visits while still having s/s.   7. History of cesarean D/w her more later in pregnancy She states she was never told she couldn't labor.   Problem list reviewed and updated.  Follow up in 1 weeks.   >50% of 20 min visit spent on counseling and coordination of care.     Cornelia Copaharlie Aeron Donaghey, Jr. MD Attending Center for  [facility](Faculty Practice)

## 2019-07-30 NOTE — Telephone Encounter (Signed)
Thanks for checking. Can you just make a nurse note in the chart and add to the pink box that she needs new ob labs and a pap? thanks

## 2019-07-30 NOTE — Telephone Encounter (Signed)
[place] Dept. To get Pts OB records, I spoke with [facility] & she stated pt never was seen there as an OB pt for this pregnancy. The appointment for our office was made after her ED visit.

## 2019-07-31 LAB — CERVICOVAGINAL ANCILLARY ONLY
Bacterial vaginitis: NEGATIVE
Candida vaginitis: POSITIVE — AB
Chlamydia: NEGATIVE
Neisseria Gonorrhea: NEGATIVE
Trichomonas: NEGATIVE

## 2019-08-06 ENCOUNTER — Telehealth: Payer: Self-pay | Admitting: Family Medicine

## 2019-08-06 NOTE — Telephone Encounter (Signed)
Spanish interpreter Eda called patient about her appointment on 9/23 @ 11:15. Patient instructed to wear a face mask for the entire appointment and no visitors are allowed. Patient screened for covid symptoms and denied having any.

## 2019-08-07 ENCOUNTER — Encounter: Payer: Self-pay | Admitting: Family Medicine

## 2019-08-08 ENCOUNTER — Other Ambulatory Visit: Payer: Self-pay

## 2019-08-08 ENCOUNTER — Ambulatory Visit (INDEPENDENT_AMBULATORY_CARE_PROVIDER_SITE_OTHER): Payer: Self-pay | Admitting: Obstetrics & Gynecology

## 2019-08-08 VITALS — BP 102/65 | HR 89 | Wt 130.0 lb

## 2019-08-08 DIAGNOSIS — Z789 Other specified health status: Secondary | ICD-10-CM

## 2019-08-08 DIAGNOSIS — Z113 Encounter for screening for infections with a predominantly sexual mode of transmission: Secondary | ICD-10-CM

## 2019-08-08 DIAGNOSIS — O0992 Supervision of high risk pregnancy, unspecified, second trimester: Secondary | ICD-10-CM

## 2019-08-08 DIAGNOSIS — Z3A16 16 weeks gestation of pregnancy: Secondary | ICD-10-CM

## 2019-08-08 DIAGNOSIS — Z23 Encounter for immunization: Secondary | ICD-10-CM

## 2019-08-08 DIAGNOSIS — O0990 Supervision of high risk pregnancy, unspecified, unspecified trimester: Secondary | ICD-10-CM

## 2019-08-08 DIAGNOSIS — Z124 Encounter for screening for malignant neoplasm of cervix: Secondary | ICD-10-CM

## 2019-08-08 DIAGNOSIS — Z98891 History of uterine scar from previous surgery: Secondary | ICD-10-CM

## 2019-08-08 LAB — HEMOGLOBIN A1C
Est. average glucose Bld gHb Est-mCnc: 105 mg/dL
Hgb A1c MFr Bld: 5.3 % (ref 4.8–5.6)

## 2019-08-08 NOTE — Progress Notes (Signed)
   PRENATAL VISIT NOTE  Subjective:  Nichole Soto is a 28 y.o. G2P1001 at [redacted]w[redacted]d being seen today for ongoing prenatal care.  She is currently monitored for the following issues for this high-risk pregnancy and has Abnormal shape or position of gravid uterus and adnexa, antepartum; Supervision of high risk pregnancy, antepartum; Vanishing twin syndrome; Language barrier; Threatened abortion; Vaginal spotting; Vaginal discharge; History of malpresentation; and History of cesarean delivery on their problem list.  Patient reports no complaints.   .  .   . Denies leaking of fluid.   The following portions of the patient's history were reviewed and updated as appropriate: allergies, current medications, past family history, past medical history, past social history, past surgical history and problem list.   Objective:   Vitals:   08/08/19 1120  Weight: 130 lb (59 kg)    Fetal Status:           General:  Alert, oriented and cooperative. Patient is in no acute distress.  Skin: Skin is warm and dry. No rash noted.   Cardiovascular: Normal heart rate noted  Respiratory: Normal respiratory effort, no problems with respiration noted  Abdomen: Soft, gravid, appropriate for gestational age.        Pelvic: Cervical exam performed       Old brown blood seen. This has been going on for about a month.  Extremities: Normal range of motion.     Mental Status: Normal mood and affect. Normal behavior. Normal judgment and thought content.   Assessment and Plan:  Pregnancy: G2P1001 at [redacted]w[redacted]d 1. Supervision of high risk pregnancy, antepartum - Hemoglobin A1c - Cytology - PAP( [place]) - MFM anatomy u/s scheduled for next month  2. Language barrier - interpretor used for visit  3. History of cesarean delivery - This was done for a transverse lie. If this baby is vertex, she would like a TOLAC  Preterm labor symptoms and general obstetric precautions including but not limited to  vaginal bleeding, contractions, leaking of fluid and fetal movement were reviewed in detail with the patient. Please refer to After Visit Summary for other counseling recommendations.   Return in about 4 weeks (around 09/05/2019) for in person.  Future Appointments  Date Time Provider [place]  08/26/2019 11:00 AM [place] MFC-US  08/26/2019 11:00 AM [facility]-MFC [place] 3 [facility]-MFCUS MFC-US    Emily Filbert, MD

## 2019-08-09 LAB — OBSTETRIC PANEL, INCLUDING HIV
Antibody Screen: NEGATIVE
Basophils Absolute: 0 10*3/uL (ref 0.0–0.2)
Basos: 0 %
EOS (ABSOLUTE): 0.2 10*3/uL (ref 0.0–0.4)
Eos: 2 %
HIV Screen 4th Generation wRfx: NONREACTIVE
Hematocrit: 31.2 % — ABNORMAL LOW (ref 34.0–46.6)
Hemoglobin: 10.4 g/dL — ABNORMAL LOW (ref 11.1–15.9)
Hepatitis B Surface Ag: NEGATIVE
Immature Grans (Abs): 0.1 10*3/uL (ref 0.0–0.1)
Immature Granulocytes: 1 %
Lymphocytes Absolute: 1.6 10*3/uL (ref 0.7–3.1)
Lymphs: 18 %
MCH: 27.7 pg (ref 26.6–33.0)
MCHC: 33.3 g/dL (ref 31.5–35.7)
MCV: 83 fL (ref 79–97)
Monocytes Absolute: 0.4 10*3/uL (ref 0.1–0.9)
Monocytes: 4 %
Neutrophils Absolute: 6.7 10*3/uL (ref 1.4–7.0)
Neutrophils: 75 %
Platelets: 275 10*3/uL (ref 150–450)
RBC: 3.75 x10E6/uL — ABNORMAL LOW (ref 3.77–5.28)
RDW: 15.2 % (ref 11.7–15.4)
RPR Ser Ql: NONREACTIVE
Rh Factor: POSITIVE
Rubella Antibodies, IGG: 2.93 index (ref >0.99)
WBC: 8.9 10*3/uL (ref 3.4–10.8)

## 2019-08-12 LAB — CYTOLOGY - PAP
Chlamydia: NEGATIVE
Diagnosis: NEGATIVE
Molecular Disclaimer: NEGATIVE
Molecular Disclaimer: NORMAL
Neisseria Gonorrhea: NEGATIVE

## 2019-08-16 ENCOUNTER — Encounter (HOSPITAL_COMMUNITY): Payer: Self-pay | Admitting: *Deleted

## 2019-08-16 ENCOUNTER — Inpatient Hospital Stay (HOSPITAL_COMMUNITY)
Admission: EM | Admit: 2019-08-16 | Discharge: 2019-08-16 | Disposition: A | Discharge status: Home / Self Care | Payer: Self-pay | Attending: Obstetrics & Gynecology | Admitting: Obstetrics & Gynecology

## 2019-08-16 ENCOUNTER — Other Ambulatory Visit: Payer: Self-pay

## 2019-08-16 ENCOUNTER — Inpatient Hospital Stay (HOSPITAL_BASED_OUTPATIENT_CLINIC_OR_DEPARTMENT_OTHER): Payer: Self-pay

## 2019-08-16 DIAGNOSIS — O4692 Antepartum hemorrhage, unspecified, second trimester: Secondary | ICD-10-CM

## 2019-08-16 DIAGNOSIS — R109 Unspecified abdominal pain: Secondary | ICD-10-CM | POA: Diagnosis present

## 2019-08-16 DIAGNOSIS — Z3A17 17 weeks gestation of pregnancy: Secondary | ICD-10-CM | POA: Insufficient documentation

## 2019-08-16 DIAGNOSIS — O99013 Anemia complicating pregnancy, third trimester: Secondary | ICD-10-CM | POA: Diagnosis present

## 2019-08-16 DIAGNOSIS — O200 Threatened abortion: Secondary | ICD-10-CM

## 2019-08-16 DIAGNOSIS — O26899 Other specified pregnancy related conditions, unspecified trimester: Secondary | ICD-10-CM

## 2019-08-16 LAB — URINALYSIS, ROUTINE W REFLEX MICROSCOPIC
Bacteria, UA: NONE SEEN
Bilirubin Urine: NEGATIVE
Glucose, UA: NEGATIVE mg/dL
Ketones, ur: NEGATIVE mg/dL
Leukocytes,Ua: NEGATIVE
Nitrite: NEGATIVE
Protein, ur: NEGATIVE mg/dL
Specific Gravity, Urine: 1.01 (ref 1.005–1.030)
pH: 7 (ref 5.0–8.0)

## 2019-08-16 MED ORDER — OXYCODONE-ACETAMINOPHEN 5-325 MG PO TABS: 1.0000 | ORAL_TABLET | ORAL | 0 refills | Status: AC | PRN

## 2019-08-16 MED ORDER — OXYCODONE-ACETAMINOPHEN 5-325 MG PO TABS
2.0000 | ORAL_TABLET | Freq: Once | ORAL | Status: AC
  Administered 2019-08-16: 2 via ORAL
  Filled 2019-08-16: qty 2

## 2019-08-16 NOTE — ED Triage Notes (Signed)
PT was being helped out of a [facility]ian[facility]i when [employer] arrived.  Spanish speaking, w/ interpretation stated she is 4 months pregnant and passing "lots of blood, clots".  RN contacted MAU and Charge RN consented for pt to be transported to MAU.

## 2019-08-16 NOTE — Discharge Instructions (Signed)
Sin relaciones seexuales

## 2019-08-16 NOTE — MAU Note (Signed)
INTERPRETERDanne [phone].   PT SAYS  SHE WENT TO [place] AT 0630 THEN WAS SENT HERE.  CAME FOR PAIN AT 0100 AND BLEEDING- STARTED 0230.  LAST SEX- WED. NO BLOOD ON PERINEUM . PAIN IS AN 8 - NO MED

## 2019-08-16 NOTE — MAU Provider Note (Addendum)
Chief Complaint:  Vaginal Bleeding   First Provider Initiated Contact with Patient 08/16/19 [phone]      HPI: Nichole Soto is a 28 y.o. G2P1001 at 48w6dwho presents to maternity admissions reporting pelvic cramping and heavy bleeding with clots starting at about 1am this morning.  Had some spotting earlier in pregnancy but never this heavy.  [place] in mid September showed normal amniotic fluid volume She denies LOF, vaginal itching/burning, urinary symptoms, h/a, dizziness, n/v, diarrhea, constipation or fever/chills.    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  RN Note: INTERPRETERJoelene [phone].   PT SAYS  SHE WENT TO [place] AT 0630 THEN WAS SENT HERE.  CAME FOR PAIN AT 0100 AND BLEEDING- STARTED 0230.  LAST SEX- WED. NO BLOOD ON PERINEUM . PAIN IS AN 8 - NO MED   Past Medical History: Past Medical History:  Diagnosis Date  . Medical history non-contributory     Past obstetric history: OB History  Gravida Para Term Preterm AB Living  2 1 1     1   SAB TAB Ectopic Multiple Live Births          1    # Outcome Date GA Lbr Len/2nd Weight Sex Delivery Anes PTL Lv  2 Current           1 Term 08/10/11    M CS-Unspec   LIV     Complications: Malpresentation of fetus    Obstetric Comments  G1: 2012, term c/s for transverse. Pt never told she couldn't labor in the future.     Past Surgical History: Past Surgical History:  Procedure Laterality Date  . CESAREAN SECTION      Family History: History reviewed. No pertinent family history.  Social History: Social History   Tobacco Use  . Smoking status: Never Smoker  . Smokeless tobacco: Never Used  Substance Use Topics  . Alcohol use: Not Currently  . Drug use: Never    Allergies: No Known Allergies  Meds:  Medications Prior to Admission  Medication Sig Dispense Refill Last Dose  . Prenatal Vit-Fe Fumarate-FA (PREPLUS) 27-1 MG TABS Take 1 tablet by mouth daily. 30  tablet 13 08/15/2019 at Unknown time  . doxylamine, Sleep, (UNISOM) 25 MG tablet Take 1 tablet (25 mg total) by mouth at bedtime as needed. 30 tablet 0     I have reviewed patient's Past Medical Hx, Surgical Hx, Family Hx, Social Hx, medications and allergies.   ROS:  Review of Systems  Constitutional: Negative for chills and fever.  Respiratory: Negative for shortness of breath.   Gastrointestinal: Negative for constipation, diarrhea and nausea.  Genitourinary: Positive for pelvic pain and vaginal bleeding.  Musculoskeletal: Negative for back pain.  Neurological: Negative for dizziness.   Other systems negative  Physical Exam   Patient Vitals for the past 24 hrs:  BP Temp Pulse Resp  08/16/19 0648 108/63 98.2 F (36.8 C) 85 20   Constitutional: Well-developed, well-nourished female in no acute distress, but in pain with cramping.  Cardiovascular: normal rate and rhythm Respiratory: normal effort, clear to auscultation bilaterally GI: Abd soft, non-tender, gravid appropriate for gestational age.   No rebound or guarding. MS: Extremities nontender, no edema, normal ROM Neurologic: Alert and oriented x 4.  GU: Neg CVAT.  PELVIC EXAM: Cervix pink, visually closed, without lesion, vaginal walls and external genitalia normal  There is moderate to large burgundy/watery blood in vault.  Cervix is fingertip external os/closed internal os, long  FHT:  153 bpm   Labs:  O/Positive/-- (09/24 1152)  Imaging:  No results found.   MAU Course/MDM: Will order [place] to rule out previa or abruption or other abnormalities >> no sign of abruption or previa, per tech some debris was seen in lower uterine segment -- possibly resolving [facility] and/or from vanishing twin  Report given to oncoming provider Hansel Feinstein CNM, MSN Certified Nurse-Midwife 08/16/2019  6:59 AM  *Consult with Dr. Hulan Fray @ [phone] - notified of patient's complaints, assessments, lab & U/S results,  recommended tx plan continue with appts as scheduled - ok to d/c home Reassessment @ 0945: bleeding is lighter on pad, but patient still complains of abdominal pain; pain rated 10/10. Offered pain medication - pt accepts. [employer] Interpreter utilized Sol [employer]azing [id number] (in person interpreter occupied on another unit) Pain reassessment prior to d/c home = 0/10  Assessment: Intrauterine pregnancy at [redacted]w[redacted]d Vaginal bleeding in second trimester pregnancy  Plan: Abdominal cramping affecting pregnancy  - Rx Percocet 5/325 mg 1 po every 4 hrs prn pain x 3 days - Information provided on abdominal pain in pregnancy   Vaginal bleeding in pregnancy, second trimester  - Information provided on vaginal bleeding in 2nd trimester pregnancy   Threatened miscarriage  - Advised to Return to MAU:  If you have heavier bleeding that soaks through more that 2 pads per hour for an hour or more  If you bleed so much that you feel like you might pass out or you do pass out  If you have significant abdominal pain that is not improved with Tylenol   If you develop a fever > 100.5 - Information provided on threatened miscarriage   - Discharge patient - Keep scheduled appts at [facility] on 08/26/19 and 09/09/19 - Patient verbalized an understanding of the plan of care and agrees.    Laury Deep, CNM 08/16/2019 10:00 AM

## 2019-08-26 ENCOUNTER — Encounter (HOSPITAL_COMMUNITY): Payer: Self-pay | Admitting: *Deleted

## 2019-08-26 ENCOUNTER — Other Ambulatory Visit: Payer: Self-pay

## 2019-08-26 ENCOUNTER — Other Ambulatory Visit (HOSPITAL_COMMUNITY): Payer: Self-pay | Admitting: *Deleted

## 2019-08-26 ENCOUNTER — Ambulatory Visit (HOSPITAL_COMMUNITY): Payer: Self-pay | Admitting: *Deleted

## 2019-08-26 ENCOUNTER — Other Ambulatory Visit: Payer: Self-pay | Admitting: Obstetrics and Gynecology

## 2019-08-26 ENCOUNTER — Ambulatory Visit (HOSPITAL_COMMUNITY)
Admission: RE | Admit: 2019-08-26 | Discharge: 2019-08-26 | Disposition: A | Discharge status: Home / Self Care | Payer: Self-pay | Source: Ambulatory Visit | Attending: Obstetrics and Gynecology | Admitting: Obstetrics and Gynecology

## 2019-08-26 DIAGNOSIS — O3112X0 Continuing pregnancy after spontaneous abortion of one fetus or more, second trimester, not applicable or unspecified: Secondary | ICD-10-CM

## 2019-08-26 DIAGNOSIS — Z3A19 19 weeks gestation of pregnancy: Secondary | ICD-10-CM

## 2019-08-26 DIAGNOSIS — O30002 Twin pregnancy, unspecified number of placenta and unspecified number of amniotic sacs, second trimester: Secondary | ICD-10-CM

## 2019-08-26 DIAGNOSIS — O26899 Other specified pregnancy related conditions, unspecified trimester: Secondary | ICD-10-CM

## 2019-08-26 DIAGNOSIS — O0990 Supervision of high risk pregnancy, unspecified, unspecified trimester: Secondary | ICD-10-CM

## 2019-08-26 DIAGNOSIS — R109 Unspecified abdominal pain: Secondary | ICD-10-CM

## 2019-08-26 DIAGNOSIS — O4692 Antepartum hemorrhage, unspecified, second trimester: Secondary | ICD-10-CM

## 2019-08-26 DIAGNOSIS — O34219 Maternal care for unspecified type scar from previous cesarean delivery: Secondary | ICD-10-CM

## 2019-08-26 DIAGNOSIS — O34592 Maternal care for other abnormalities of gravid uterus, second trimester: Secondary | ICD-10-CM

## 2019-08-26 DIAGNOSIS — Z362 Encounter for other antenatal screening follow-up: Secondary | ICD-10-CM

## 2019-08-26 NOTE — Progress Notes (Signed)
Interpreter present

## 2019-08-27 ENCOUNTER — Telehealth: Payer: Self-pay | Admitting: *Deleted

## 2019-08-27 ENCOUNTER — Encounter: Payer: Self-pay | Admitting: Obstetrics and Gynecology

## 2019-08-27 DIAGNOSIS — O42919 Preterm premature rupture of membranes, unspecified as to length of time between rupture and onset of labor, unspecified trimester: Secondary | ICD-10-CM | POA: Insufficient documentation

## 2019-08-27 HISTORY — DX: Preterm premature rupture of membranes, unspecified as to length of time between rupture and onset of labor, unspecified trimester: O42.919

## 2019-08-27 NOTE — Telephone Encounter (Signed)
I called Nichole Soto with Interpreter Door and notified her Dr. Ilda Basset wants her to come in for a nurse visit this week for a temperature check. She agreed to 3pm tomorrow. I also reviewed her next obfu appointment with her. She voices understanding. [name]

## 2019-08-27 NOTE — Telephone Encounter (Signed)
-----   Message from Aletha Halim, MD sent at 08/27/2019 12:48 PM EDT ----- Please have pt come in for temp check with rn this week and then hrob visit in person next week with md

## 2019-08-28 ENCOUNTER — Ambulatory Visit (INDEPENDENT_AMBULATORY_CARE_PROVIDER_SITE_OTHER): Payer: Self-pay | Admitting: Emergency Medicine

## 2019-08-28 ENCOUNTER — Other Ambulatory Visit: Payer: Self-pay

## 2019-08-28 VITALS — Temp 98.0°F

## 2019-08-28 DIAGNOSIS — Z3A19 19 weeks gestation of pregnancy: Secondary | ICD-10-CM

## 2019-08-28 DIAGNOSIS — O0990 Supervision of high risk pregnancy, unspecified, unspecified trimester: Secondary | ICD-10-CM

## 2019-08-28 DIAGNOSIS — O0992 Supervision of high risk pregnancy, unspecified, second trimester: Secondary | ICD-10-CM

## 2019-08-28 NOTE — Progress Notes (Signed)
Patient seen and assessed by nursing staff during this encounter. I have reviewed the chart and agree with the documentation and plan.  Kerry Hough, PA-C 08/28/2019 4:09 PM

## 2019-08-28 NOTE — Progress Notes (Signed)
Pt here today to have her temperature checked. Pt temperature today 98.0.   Courtney RN,  08/28/19  [phone]

## 2019-08-31 ENCOUNTER — Encounter (HOSPITAL_COMMUNITY): Payer: Self-pay | Admitting: *Deleted

## 2019-08-31 ENCOUNTER — Inpatient Hospital Stay (HOSPITAL_COMMUNITY)
Admission: AD | Admit: 2019-08-31 | Discharge: 2019-08-31 | DRG: 779 | Disposition: A | Discharge status: Home / Self Care | Payer: Medicaid Other | Attending: Family Medicine | Admitting: Family Medicine

## 2019-08-31 DIAGNOSIS — Q513 Bicornate uterus: Secondary | ICD-10-CM

## 2019-08-31 DIAGNOSIS — R109 Unspecified abdominal pain: Secondary | ICD-10-CM

## 2019-08-31 DIAGNOSIS — O42012 Preterm premature rupture of membranes, onset of labor within 24 hours of rupture, second trimester: Secondary | ICD-10-CM

## 2019-08-31 DIAGNOSIS — O034 Incomplete spontaneous abortion without complication: Secondary | ICD-10-CM

## 2019-08-31 DIAGNOSIS — O4692 Antepartum hemorrhage, unspecified, second trimester: Secondary | ICD-10-CM

## 2019-08-31 DIAGNOSIS — Z3A20 20 weeks gestation of pregnancy: Secondary | ICD-10-CM | POA: Diagnosis not present

## 2019-08-31 DIAGNOSIS — O42912 Preterm premature rupture of membranes, unspecified as to length of time between rupture and onset of labor, second trimester: Secondary | ICD-10-CM | POA: Diagnosis present

## 2019-08-31 DIAGNOSIS — O021 Missed abortion: Principal | ICD-10-CM | POA: Diagnosis present

## 2019-08-31 DIAGNOSIS — Z20828 Contact with and (suspected) exposure to other viral communicable diseases: Secondary | ICD-10-CM | POA: Diagnosis present

## 2019-08-31 DIAGNOSIS — O26899 Other specified pregnancy related conditions, unspecified trimester: Secondary | ICD-10-CM

## 2019-08-31 LAB — TYPE AND SCREEN
ABO/RH(D): O POS
Antibody Screen: NEGATIVE

## 2019-08-31 LAB — CBC
HCT: 30.3 % — ABNORMAL LOW (ref 36.0–46.0)
Hemoglobin: 10.2 g/dL — ABNORMAL LOW (ref 12.0–15.0)
MCH: 28.6 pg (ref 26.0–34.0)
MCHC: 33.7 g/dL (ref 30.0–36.0)
MCV: 84.9 fL (ref 80.0–100.0)
Platelets: 279 10*3/uL (ref 150–400)
RBC: 3.57 MIL/uL — ABNORMAL LOW (ref 3.87–5.11)
RDW: 14.3 % (ref 11.5–15.5)
WBC: 17.1 10*3/uL — ABNORMAL HIGH (ref 4.0–10.5)
nRBC: 0 % (ref 0.0–0.2)

## 2019-08-31 LAB — SARS CORONAVIRUS 2 BY RT PCR (HOSPITAL ORDER, PERFORMED IN ~~LOC~~ HOSPITAL LAB): SARS Coronavirus 2: NEGATIVE

## 2019-08-31 LAB — WET PREP, GENITAL
Sperm: NONE SEEN
Trich, Wet Prep: NONE SEEN
Yeast Wet Prep HPF POC: NONE SEEN

## 2019-08-31 MED ORDER — ONDANSETRON HCL 4 MG PO TABS: 4.0000 mg | ORAL_TABLET | ORAL | Status: DC | PRN

## 2019-08-31 MED ORDER — ONDANSETRON HCL 4 MG/2ML IJ SOLN: 4.0000 mg | INTRAMUSCULAR | Status: DC | PRN

## 2019-08-31 MED ORDER — SIMETHICONE 80 MG PO CHEW: 80.0000 mg | CHEWABLE_TABLET | ORAL | Status: DC | PRN

## 2019-08-31 MED ORDER — TETANUS-DIPHTH-ACELL PERTUSSIS 5-2.5-18.5 LF-MCG/0.5 IM SUSP: 0.5000 mL | Freq: Once | INTRAMUSCULAR | Status: DC

## 2019-08-31 MED ORDER — OXYTOCIN 40 UNITS IN NORMAL SALINE INFUSION - SIMPLE MED
INTRAVENOUS | Status: AC
  Administered 2019-08-31: 11:00:00 via INTRAVENOUS
  Filled 2019-08-31: qty 1000

## 2019-08-31 MED ORDER — MISOPROSTOL 200 MCG PO TABS
1000.0000 ug | ORAL_TABLET | Freq: Once | ORAL | Status: AC
  Administered 2019-08-31: 11:00:00 1000 ug via RECTAL

## 2019-08-31 MED ORDER — IBUPROFEN 600 MG PO TABS: 600.0000 mg | ORAL_TABLET | Freq: Four times a day (QID) | ORAL | 0 refills | Status: DC

## 2019-08-31 MED ORDER — IBUPROFEN 600 MG PO TABS: 600.0000 mg | ORAL_TABLET | Freq: Four times a day (QID) | ORAL | Status: DC

## 2019-08-31 MED ORDER — OXYTOCIN 40 UNITS IN NORMAL SALINE INFUSION - SIMPLE MED
1.0000 m[IU]/min | Freq: Once | INTRAVENOUS | Status: DC
  Administered 2019-08-31: 11:00:00 via INTRAVENOUS

## 2019-08-31 MED ORDER — ACETAMINOPHEN 325 MG PO TABS: 650.0000 mg | ORAL_TABLET | ORAL | Status: DC | PRN

## 2019-08-31 MED ORDER — WITCH HAZEL-GLYCERIN EX PADS: 1.0000 "application " | MEDICATED_PAD | CUTANEOUS | Status: DC | PRN

## 2019-08-31 MED ORDER — MEASLES, MUMPS & RUBELLA VAC IJ SOLR: 0.5000 mL | Freq: Once | INTRAMUSCULAR | Status: DC

## 2019-08-31 MED ORDER — MISOPROSTOL 200 MCG PO TABS
ORAL_TABLET | ORAL | Status: AC
  Administered 2019-08-31: 1000 ug via RECTAL
  Filled 2019-08-31: qty 5

## 2019-08-31 MED ORDER — SENNOSIDES-DOCUSATE SODIUM 8.6-50 MG PO TABS: 2.0000 | ORAL_TABLET | ORAL | Status: DC

## 2019-08-31 MED ORDER — BENZOCAINE-MENTHOL 20-0.5 % EX AERO: 1.0000 "application " | INHALATION_SPRAY | CUTANEOUS | Status: DC | PRN

## 2019-08-31 MED ORDER — LACTATED RINGERS IV SOLN
INTRAVENOUS | Status: DC
  Administered 2019-08-31: 10:00:00 via INTRAVENOUS

## 2019-08-31 MED ORDER — OXYCODONE-ACETAMINOPHEN 5-325 MG PO TABS
1.0000 | ORAL_TABLET | Freq: Once | ORAL | Status: AC
  Administered 2019-08-31: 1 via ORAL
  Filled 2019-08-31: qty 1

## 2019-08-31 MED ORDER — DIBUCAINE (PERIANAL) 1 % EX OINT: 1.0000 "application " | TOPICAL_OINTMENT | CUTANEOUS | Status: DC | PRN

## 2019-08-31 MED ORDER — PRENATAL MULTIVITAMIN CH: 1.0000 | ORAL_TABLET | Freq: Every day | ORAL | Status: DC

## 2019-08-31 MED ORDER — DIPHENHYDRAMINE HCL 25 MG PO CAPS: 25.0000 mg | ORAL_CAPSULE | Freq: Four times a day (QID) | ORAL | Status: DC | PRN

## 2019-08-31 MED ORDER — FENTANYL CITRATE (PF) 100 MCG/2ML IJ SOLN
50.0000 ug | Freq: Once | INTRAMUSCULAR | Status: AC
  Administered 2019-08-31: 50 ug via INTRAMUSCULAR
  Filled 2019-08-31: qty 2

## 2019-08-31 MED ORDER — OXYTOCIN 40 UNITS IN NORMAL SALINE INFUSION - SIMPLE MED: 40.0000 m[IU]/min | Freq: Once | INTRAVENOUS | Status: DC

## 2019-08-31 MED ORDER — OXYTOCIN 10 UNIT/ML IJ SOLN
20.0000 [IU] | Freq: Once | INTRAMUSCULAR | Status: AC
  Administered 2019-08-31: 11:00:00 20 [IU]

## 2019-08-31 MED ORDER — OXYTOCIN 10 UNIT/ML IJ SOLN
INTRAMUSCULAR | Status: AC
  Administered 2019-08-31: 20 [IU]
  Filled 2019-08-31: qty 2

## 2019-08-31 MED ORDER — ZOLPIDEM TARTRATE 5 MG PO TABS: 5.0000 mg | ORAL_TABLET | Freq: Every evening | ORAL | Status: DC | PRN

## 2019-08-31 MED ORDER — OXYTOCIN 40 UNITS IN NORMAL SALINE INFUSION - SIMPLE MED
10.0000 [IU]/h | Freq: Once | INTRAVENOUS | Status: AC
  Administered 2019-08-31: 10 [IU]/h via INTRAVENOUS
  Filled 2019-08-31: qty 1000

## 2019-08-31 NOTE — Discharge Instructions (Signed)
Parto vaginal, cuidados posteriores °Vaginal Delivery, Care After °Este folleto le brinda información sobre cómo cuidarse después del parto. Su médico también podrá darle indicaciones más específicas. Comuníquese con su médico si tiene problemas o preguntas. °¿Qué puedo esperar después del procedimiento? °Después de un parto, es frecuente tener lo siguiente: °· Dolor en el abdomen, la vagina y la zona entre la abertura vaginal y el ano (perineo). °· Cansancio (fatiga). °· Calambres. °· Dolor en las mamas asociado a la congestión mamaria. °· Algo de sangrado y secreción por la vagina. Esto puede continuar durante aproximadamente 6 semanas. El sangrado vaginal y el flujo primero será rojo y luego se convertirá en rosa, amarillo, y finalmente blanco. °· Molestias en la vagina o el perineo si le hicieron episiotomía o tuvo un desgarro vaginal. Esto puede persistir durante varias semanas. °· Emociones que cambian rápidamente. Las emociones más frecuentes: °? Tristeza. °? Ira. °? Negación. °? Culpa. °? Depresión. °Siga estas indicaciones en su casa: °Cuidados de la vagina y el perineo °· Mantenga el perineo limpio y seco, como se lo haya indicado el médico. °· Cuando vaya al baño, siempre higienícese de adelante hacia atrás. °· Si le realizaron una episiotomía o tuvo un desgarro vaginal, contrólese para detectar signos de infección, como: °? Aumento del enrojecimiento, la hinchazón o el dolor en la zona perineal. °? Pus o secreción con mal olor de la herida o de la vagina. °· Para aliviar el dolor en la zona de la herida o el dolor causado por hemorroides, trate de tomar un baño de asiento tibio 2 o 3 veces por día. °· No use tampones ni se haga duchas vaginales hasta que el médico lo autorice. °Medicamentos °· Tome los medicamentos de venta libre y los recetados solamente como se lo haya indicado el médico. °· Si le recetaron un antibiótico, tómelo como se lo haya indicado el médico. No deje de tomar el antibiótico  aunque comience a sentirse mejor. °Comida y bebida ° °· Beba suficiente líquido para mantener la orina de color amarillo pálido. °· Coma alimentos ricos en fibras todos los días. Estos alimentos pueden ayudarla a prevenir o aliviar el estreñimiento. Los alimentos ricos en fibras incluyen, entre otros: °? Panes y cereales integrales. °? Arroz integral. °? Frijoles. °? Frutas y verduras frescas. °Actividad °· Si es posible, trate de que alguien la ayude con las actividades del hogar al menos durante algunos días después de salir del hospital. °· Retome sus actividades normales como se lo haya indicado el médico. Pregúntele al médico qué actividades son seguras para usted. °· Descanse todo lo que pueda. °· Hable con el médico sobre cuándo puede retomar la actividad sexual. Esto puede depender de lo siguiente: °? Riesgo de sufrir una infección. °? Velocidad de cicatrización. °? Comodidad y deseo de retomar la actividad sexual. °Apoyo emocional °· Considere buscar ayuda para superar la pérdida. Algunas de las formas de apoyo que podría considerar incluyen a su líder religioso, amigos, familiares, un psicoterapeuta o un grupo de apoyo por duelo. °Instrucciones generales °· Use un sostén firme y bien ajustado. °· Si expulsa un coágulo de sangre, guárdelo y llame al médico para informárselo. No deseche los coágulos de sangre por el inodoro antes de recibir instrucciones del médico antes. °· Cumpla con todas las visitas de posparto programadas. En estas visitas, el médico la controlará para asegurarse de que esté sanando tanto física como emocionalmente. °Comuníquese con un médico si: °· Se siente triste o deprimida. °· Tiene problemas para comer   o dormir. °· Pierde el interés en actividades que solían gustarle. °· Elimina un coágulo de sangre grande por la vagina. °· Tiene pus o secreción con mal olor de la herida o de la vagina. °· Tiene más enrojecimiento, hinchazón o dolor en la zona perineal. °· Siente dolor o ardor al  orinar. °· Orina con más frecuencia que lo normal. °· Tiene fiebre. °· Las mamas se ponen rojas, se endurecen o duelen. °· Se siente mareado o aturdido. °· Tiene una erupción cutánea. °· Siente náuseas o vomita. °· No ha tenido el período menstrual en las últimas 12 semanas después del parto. °Solicite ayuda de inmediato si: °· Tiene dolor persistente que no se va con las medidas de alivio ni con medicamentos. °· Tiene dolor en el pecho o dificultad para respirar. °· Tiene visión borrosa o ve manchas. °· Tiene un dolor de cabeza intenso. °· Se desmaya. °· Tiene un dolor repentino e intenso en la pierna. °· Tiene una hemorragia tan intensa de la vagina que empapa más de un apósito en una hora. El sangrado no debe ser más abundante que el período más intenso que haya tenido. °· Tiene pensamientos acerca de lastimarse. °Si alguna vez siente que puede lastimarse a usted mismo o a otras personas, o tiene pensamientos de poner fin a su vida, busque ayuda de inmediato. Puede dirigirse al servicio de emergencias más cercano o comunicarse con: °· El servicio de emergencias de su localidad (911 en [place] °· Una línea de asistencia al suicida y atención en crisis, como la Línea Nacional de Prevención del Suicidio (National Suicide Prevention Lifeline), al 1-800-273-8255. Está disponible las 24 horas del día. °Resumen °· Tome los medicamentos de venta libre y los recetados solamente como se lo haya indicado el médico. °· Retome sus actividades normales como se lo haya indicado el médico. Pregúntele al médico qué actividades son seguras para usted. °· Considere buscar ayuda para superar la pérdida. El apoyo puede encontrarlo en guías religiosos, amigos, familiares, psicoterapeutas o grupos de apoyo por duelo. °· Concurra a todas las visitas de control como se lo haya indicado el médico. Esto es importante. °Esta información no tiene como fin reemplazar el consejo del médico. Asegúrese de hacerle al médico cualquier pregunta  que tenga. °Document Released: 03/17/2014 Document Revised: 02/10/2018 Document Reviewed: 12/19/2013 °Elsevier Patient Education © 2020 Elsevier Inc. ° °

## 2019-08-31 NOTE — Lactation Note (Signed)
Lactation Consultation Note  Patient Name: Nichole Soto Today's Date: 08/31/2019    Raquel, interpreter, was present for the following: Mom was given the following handouts in Spanish: [facility] After Loss & Lactation After Loss. I explained to Mom that her milk may still come in & she expressed surprise. I further explained that we don't want her breasts to become too full, as that could increase her chances for a breast infection. I asked Mom if she knew how to express her milk to relieve discomfort, if her breasts become overly full. She replied she did not. She was given the option of being given a hand pump or using her hands. She chose hand expression. I explained how to do it & colostrum was readily visible.   Mom was instructed to call her doctor if she has a fever or streaks on her breasts (the Lactation After Loss handout covers this in more detail). How to use green cabbage leaves was also discussed. Mom said she had no other questions for me.    Matthias Hughs [facility] 08/31/2019, 9:01 PM

## 2019-08-31 NOTE — Progress Notes (Signed)
Darrol Poke CNM notified of pt's pain level "10" although pt was dozing when RN checked on pt and more relaxed. No new orders currently

## 2019-08-31 NOTE — Progress Notes (Addendum)
Pt and SO were sitting on the sofa when I arrived. He was offering compassionate support to [place]. They asked questions about what to do about their baby. They had chosen [facility] and wanted to know about the cost. I made calls for them to [employer] and [employer]. We discovered the cemetary cost at [facility] is $100 and the cost at city [facility] is $2500. They were very grateful for the assistance. Interpreter Requael provided translation services.  Please page if addition support is needed. [place], MDiv

## 2019-08-31 NOTE — Progress Notes (Signed)
Patient ID: Nichole Soto, female   DOB: 23-Jun-1991, 28 y.o.   MRN: [id number] Returned about 1 hour following delivery and used a speculum and ring forceps to retrieve the placenta from the uterus. Patient desired genetic testing and so placenta and cord retrieved for [place].

## 2019-08-31 NOTE — Progress Notes (Signed)
No HR or breathing movements noted at 1325

## 2019-08-31 NOTE — MAU Note (Signed)
Pt appears more comfortable and relaxed but states her pain is still 10

## 2019-08-31 NOTE — MAU Note (Signed)
Having abd pain for 3 hrs.

## 2019-08-31 NOTE — Discharge Summary (Signed)
Postpartum Discharge Summary       Patient Name: Nichole Soto DOB: [birth year] MRN: [id number]  Date of admission: 08/31/2019 Delivering Provider: Reva Bores   Date of discharge: 08/31/2019  Admitting diagnosis: 18 wks pain Intrauterine pregnancy: [redacted]w[redacted]d     Secondary diagnosis:  Active Problems:   Inevitable abortion  Additional problems:  Pre-viable PPROM Bicornuate uterus     Discharge diagnosis: Preterm Pregnancy Delivered                                                                                                Post partum procedures:none  Augmentation: none  Complications: ROM>24 hours  Hospital course:  Onset of Labor With Vaginal Delivery     [name] at [redacted]w[redacted]d was admitted in Latent Labor on 08/31/2019. Patient had an uncomplicated labor course as follows:  Membrane Rupture Time/Date:  ,08/26/2019   Intrapartum Procedures: Episiotomy: None [1]                                         Lacerations:  None [1]  Patient had a delivery of a Non Viable infant. 08/31/2019  Information for the patient's newborn:  Nichole Soto, Girl Jellisa [[id number]]  Delivery Method: Vaginal, Spontaneous(Filed from Delivery Summary)     Pateint had an uncomplicated postpartum course.  She is ambulating, tolerating a regular diet, passing flatus, and urinating well. Patient is discharged home in stable condition on 08/31/19.  Delivery time: 10:23 AM    Magnesium Sulfate received: No BMZ received: No Rhophylac:No MMR:No Transfusion:No  Physical exam  Vitals:   08/31/19 0827 08/31/19 1226 08/31/19 1328 08/31/19 1451  BP: 114/76 109/65 111/64 (!) 102/59  Pulse: 90 92 93 91  Resp: 19 16 19 17   Temp: 97.8 F (36.6 C)   98.1 F (36.7 C)  TempSrc: Oral   Oral  SpO2: 100%     Weight:      Height:       General: alert, cooperative and no distress Lochia: appropriate Uterine Fundus: firm DVT Evaluation: No evidence of DVT seen on physical  exam. Labs: Lab Results  Component Value Date   WBC 17.1 (H) 08/31/2019   HGB 10.2 (L) 08/31/2019   HCT 30.3 (L) 08/31/2019   MCV 84.9 08/31/2019   PLT 279 08/31/2019   CMP Latest Ref Rng & Units 07/11/2019  Glucose 70 - 99 mg/dL 782(N)  BUN 6 - 20 mg/dL <5(A)  Creatinine 2.13 - 1.00 mg/dL 0.86(V)  Sodium 784 - 696 mmol/L 133(L)  Potassium 3.5 - 5.1 mmol/L 3.6  Chloride 98 - 111 mmol/L 104  CO2 22 - 32 mmol/L 19(L)  Calcium 8.9 - 10.3 mg/dL 9.1    Discharge instruction: per After Visit Summary and "Baby and Me Booklet".  After visit meds:  Allergies as of 08/31/2019   No Known Allergies     Medication List    STOP taking these medications   oxyCODONE-acetaminophen 5-325 MG tablet Commonly known as: PERCOCET/ROXICET  TAKE these medications   doxylamine (Sleep) 25 MG tablet Commonly known as: UNISOM Take 1 tablet (25 mg total) by mouth at bedtime as needed.   ibuprofen 600 MG tablet Commonly known as: ADVIL Take 1 tablet (600 mg total) by mouth every 6 (six) hours.   PrePLUS 27-1 MG Tabs Take 1 tablet by mouth daily.       Diet: routine diet  Activity: Advance as tolerated. Pelvic rest for 6 weeks.   Outpatient follow up:2 weeks Follow up Appt: Future Appointments  Date Time Provider Department Center  09/06/2019  9:15 AM Sonterra Bing, MD WOC-WOCA WOC  09/13/2019 11:15 AM Hermina Staggers, MD WOC-WOCA WOC  09/23/2019  3:00 PM WH-MFC NURSE WH-MFC MFC-US  09/23/2019  3:00 PM WH-MFC [place] 3 WH-MFCUS MFC-US   Follow up Visit: Follow-up Information    [facility] Follow up in 2 week(s).   Specialty: Obstetrics and Gynecology Why: keep appointment on 10/30 Contact information: [address] [place] [address], [address] [id number] mc [place] [address] [phone]           Please schedule this patient for Postpartum visit in: 2 weeks with the following provider: MD For C/S patients schedule nurse  incision check in weeks 2 weeks: no High risk pregnancy complicated by: PPROM Delivery mode:  SVD Anticipated Birth Control:  other/unsure PP Procedures needed: none  Schedule Integrated BH visit: yes      Newborn Data: Live born female  Birth Weight:   APGAR: 4, 2  Newborn Delivery   Birth date/time: 08/31/2019 10:23:00 Delivery type: Vaginal, Spontaneous     Disposition:morgue   08/31/2019 Reva Bores, MD

## 2019-08-31 NOTE — Progress Notes (Signed)
Pt d/c home accompanied by husband in stable condition. Pt educated on follow-up appointment, post-partum care, and medication regimen. In house spanish interpretor at bedside to assist with d/c teaching/instruction. Pt and spouse verbalized understanding of education.  Lactation present to educate on hand expression and [place] program.

## 2019-08-31 NOTE — H&P (Signed)
  Nichole Soto is an 28 y.o. G2P1001 [redacted]w[redacted]d female.   Chief Complaint: abd pain HPI: has h/o vanishing twin, bicornuate uterus and suspected PPROM at anatomy with no fluid. Comes in today with bleeding and pain, uncontrolled. Delivered in MAU. See separate note for that.  Past Medical History:  Diagnosis Date  . Medical history non-contributory     Past Surgical History:  Procedure Laterality Date  . CESAREAN SECTION      History reviewed. No pertinent family history. Social History:  reports that she has never smoked. She has never used smokeless tobacco. She reports previous alcohol use. She reports that she does not use drugs.   No Known Allergies  Medications Prior to Admission  Medication Sig Dispense Refill  . oxyCODONE-acetaminophen (PERCOCET/ROXICET) 5-325 MG tablet Take 1 tablet by mouth every 4 (four) hours as needed for severe pain.    . Prenatal Vit-Fe Fumarate-FA (PREPLUS) 27-1 MG TABS Take 1 tablet by mouth daily. 30 tablet 13  . doxylamine, Sleep, (UNISOM) 25 MG tablet Take 1 tablet (25 mg total) by mouth at bedtime as needed. 30 tablet 0     Pertinent items are noted in HPI.  Blood pressure 114/76, pulse 90, temperature 97.8 F (36.6 C), temperature source Oral, resp. rate 19, height 4\' 11"  (1.499 m), weight 60.3 kg, last menstrual period 04/13/2019, SpO2 100 %. General appearance: alert, cooperative and appears stated age Head: Normocephalic, without obvious abnormality, atraumatic Neck: supple, symmetrical, trachea midline Lungs: normal effort Heart: regular rate and rhythm Abdomen: soft, non-tender, gravid uterus Extremities: Homans sign is negative, no sign of DVT Skin: Skin color, texture, turgor normal. No rashes or lesions Neurologic: Grossly normal   Lab Results  Component Value Date   WBC 17.1 (H) 08/31/2019   HGB 10.2 (L) 08/31/2019   HCT 30.3 (L) 08/31/2019   MCV 84.9 08/31/2019   PLT 279 08/31/2019         ABO, Rh:  O/Positive/-- (09/24 1152)  Antibody: Negative (09/24 1152)  Rubella: 2.93 (09/24 1152)  RPR: Non Reactive (09/24 1152)  HBsAg: Negative (09/24 1152)  HIV: Non Reactive (09/24 1152)  GBS:       Assessment/Plan Active Problems:   Inevitable abortion  For admission. Now she is delivered, will await placenta delivery and may go to [facility].  Donnamae Jude 08/31/2019, 10:43 AM

## 2019-08-31 NOTE — MAU Provider Note (Addendum)
History     CSN: [id number]  Arrival date and time: 08/31/19 7322   First Provider Initiated Contact with Patient 08/31/19 [phone]    Chief Complaint  Patient presents with  . Abdominal Pain   Nichole Soto is a 28 y.o. G2P1 at [redacted]w[redacted]d who presents to MAU with complaints of abdominal pain. She reports abdominal pain started occurring this morning around 0100, describes as lower abdominal cramping. Rates pain 9/10- has taken tylenol for pain without relief prior to coming to MAU. She reports continued vaginal bleeding, patient reports she has been bleeding for the past 2 weeks since last MAU visit. She reports vaginal bleeding is heavy enough to wear a panty liner but denies passing clots.   Patient had anatomy US on 10/12 and noted to have oligohydramnios at that time, while a previous US on 10/2 noted normal fluid. Presumptive PPROM at [redacted]w[redacted]d based on anatomy US.   Spanish interpreter used through [place] throughout assessment and evaluation.   OB History    Gravida  2   Para  1   Term  1   Preterm      AB      Living  1     SAB      TAB      Ectopic      Multiple      Live Births  1        Obstetric Comments  G1: 2012, term c/s for transverse. Pt never told she couldn't labor in the future.         Past Medical History:  Diagnosis Date  . Medical history non-contributory     Past Surgical History:  Procedure Laterality Date  . CESAREAN SECTION      History reviewed. No pertinent family history.  Social History   Tobacco Use  . Smoking status: Never Smoker  . Smokeless tobacco: Never Used  Substance Use Topics  . Alcohol use: Not Currently  . Drug use: Never    Allergies: No Known Allergies  Medications Prior to Admission  Medication Sig Dispense Refill Last Dose  . acetaminophen (TYLENOL) 500 MG tablet Take 500 mg by mouth every 6 (six) hours as needed.     . Prenatal Vit-Fe Fumarate-FA (PREPLUS) 27-1 MG TABS Take 1 tablet by  mouth daily. 30 tablet 13 08/30/2019 at Unknown time  . doxylamine, Sleep, (UNISOM) 25 MG tablet Take 1 tablet (25 mg total) by mouth at bedtime as needed. 30 tablet 0     Review of Systems  Constitutional: Negative.   Respiratory: Negative.   Cardiovascular: Negative.   Gastrointestinal: Positive for abdominal pain. Negative for constipation, diarrhea, nausea and vomiting.  Genitourinary: Positive for vaginal bleeding. Negative for difficulty urinating, dysuria, frequency, hematuria and urgency.  Musculoskeletal: Negative.   Neurological: Negative.    Physical Exam   Blood pressure (!) 96/58, pulse (!) 102, temperature 98.4 F (36.9 C), resp. rate 20, height 4\' 11"  (1.499 m), weight 60.3 kg, last menstrual period 04/13/2019, SpO2 100 %.  Physical Exam  Nursing note and vitals reviewed. Constitutional: She is oriented to person, place, and time. She appears well-developed and well-nourished. She appears distressed.  Cardiovascular: Normal rate and regular rhythm.  Respiratory: Effort normal and breath sounds normal. No respiratory distress. She has no wheezes.  GI: Soft. There is abdominal tenderness. There is no rebound and no guarding.  Tenderness in lower abdomen   Genitourinary:    Vaginal bleeding present.  There is bleeding in  the vagina.    Genitourinary Comments: Sterile speculum examination performed: Cervix pink, visually open to 1cm with small amount of mucous discharge from os, without lesion, small amount of dark red translucent blood present in vault, vaginal walls and external genitalia normal   Musculoskeletal: Normal range of motion.        General: No edema.  Neurological: She is alert and oriented to person, place, and time.  Psychiatric: She has a normal mood and affect. Her behavior is normal. Thought content normal.    MAU Course  Procedures  MDM Consult with Dr Despina Hidden- recommends abstaining from digital examination if possible and treatment of pain- okay to  follow up as scheduled in the office with outpatient management   Percocet x1 given to patient @ 0519  Reassessment @ [phone]- patient seems more relaxed, is in and out of sleep, using interpreter assessed pain- patient reports pain is 10/10 after medication. Assessed bleeding on pad - small amount of blood noted, less than initial arrival to MAU.   IM fentanyl ordered @0700  IM fentanyl given @0722   Care taken over by NP @0805  , CNM 08/31/19, 8:07 AM  BP 114/76 (BP Location: Left Arm)   Pulse 90   Temp 97.8 F (36.6 C) (Oral)   Resp 19   Ht 4\' 11"  (1.499 m)   Wt 60.3 kg   LMP 04/13/2019 (Approximate)   SpO2 100% Comment: ra  BMI 26.86 kg/m   Pt reports continued pain 10/10 after receiving fentanyl & percocet.  Contractions palpated on exam. Repeat sterile spec was performed which showed increase in active bleeding but unable to visualize cervix.  C/w Dr. 09/02/19. Will admit patient for labor.  Digital exam performed & patient is 1.5-2/70/-3  Explained previous ultrasound & today's findings with patient & her spouse. Extreme likelihood that she would deliver while here & if so that the baby was non viable.  Spanish interpreter was at bedside for exam & discussion. Assessment and Plan  A: PPROM 20 wks Labor  P: Admit to birthing suite Epidural prn  04/15/2019, NP

## 2019-09-02 ENCOUNTER — Encounter (HOSPITAL_COMMUNITY): Payer: Self-pay | Admitting: *Deleted

## 2019-09-04 LAB — GC/CHLAMYDIA PROBE AMP (~~LOC~~) NOT AT ARMC
Chlamydia: NEGATIVE
Comment: NEGATIVE
Comment: NORMAL
Neisseria Gonorrhea: NEGATIVE

## 2019-09-04 NOTE — Progress Notes (Signed)
I offered support by telephone to pt since she is discharged already.  Tomorrow they will plan to have the funeral.  She would like to have a prayer said at the funeral but they do not know any local clergy.  I offered to be a support to them at the funeral and they were very appreciative.    [place], [place] Pager, [phone] 5:19 PM    09/04/19 1700  Clinical Encounter Type  Visited With Patient and family together  Visit Type Spiritual support  Spiritual Encounters  Spiritual Needs Grief support  Stress Factors  Patient Stress Factors Loss

## 2019-09-06 ENCOUNTER — Encounter: Payer: Self-pay | Admitting: Obstetrics and Gynecology

## 2019-09-09 ENCOUNTER — Encounter: Payer: Self-pay | Admitting: Obstetrics and Gynecology

## 2019-09-13 ENCOUNTER — Ambulatory Visit (INDEPENDENT_AMBULATORY_CARE_PROVIDER_SITE_OTHER): Payer: Self-pay | Admitting: Clinical

## 2019-09-13 ENCOUNTER — Encounter: Payer: Self-pay | Admitting: Obstetrics and Gynecology

## 2019-09-13 ENCOUNTER — Other Ambulatory Visit: Payer: Self-pay

## 2019-09-13 ENCOUNTER — Ambulatory Visit (INDEPENDENT_AMBULATORY_CARE_PROVIDER_SITE_OTHER): Payer: Self-pay | Admitting: Obstetrics and Gynecology

## 2019-09-13 VITALS — BP 102/63 | HR 74 | Wt 128.0 lb

## 2019-09-13 DIAGNOSIS — Z309 Encounter for contraceptive management, unspecified: Secondary | ICD-10-CM | POA: Insufficient documentation

## 2019-09-13 DIAGNOSIS — Z30013 Encounter for initial prescription of injectable contraceptive: Secondary | ICD-10-CM

## 2019-09-13 DIAGNOSIS — Z3042 Encounter for surveillance of injectable contraceptive: Secondary | ICD-10-CM

## 2019-09-13 DIAGNOSIS — F4321 Adjustment disorder with depressed mood: Secondary | ICD-10-CM

## 2019-09-13 MED ORDER — MEDROXYPROGESTERONE ACETATE 150 MG/ML IM SUSP
150.0000 mg | Freq: Once | INTRAMUSCULAR | Status: AC
  Administered 2019-09-13: 150 mg via INTRAMUSCULAR

## 2019-09-13 NOTE — Patient Instructions (Signed)
Mantenimiento de la salud en las mujeres Health Maintenance, Female Adoptar un estilo de vida saludable y recibir atencin preventiva son importantes para promover la salud y el bienestar. Consulte al mdico sobre:  El esquema adecuado para hacerse pruebas y exmenes peridicos.  Cosas que puede hacer por su cuenta para prevenir enfermedades y mantenerse sana. Qu debo saber sobre la dieta, el peso y el ejercicio? Consuma una dieta saludable   Consuma una dieta que incluya muchas verduras, frutas, productos lcteos con bajo contenido de grasa y protenas magras.  No consuma muchos alimentos ricos en grasas slidas, azcares agregados o sodio. Mantenga un peso saludable El ndice de masa muscular (IMC) se utiliza para identificar problemas de peso. Proporciona una estimacin de la grasa corporal basndose en el peso y la altura. Su mdico puede ayudarle a determinar su IMC y a lograr o mantener un peso saludable. Haga ejercicio con regularidad Haga ejercicio con regularidad. Esta es una de las prcticas ms importantes que puede hacer por su salud. La mayora de los adultos deben seguir estas pautas:  Realizar, al menos, 150minutos de actividad fsica por semana. El ejercicio debe aumentar la frecuencia cardaca y hacerlo transpirar (ejercicio de intensidad moderada).  Hacer ejercicios de fortalecimiento por lo menos dos veces por semana. Agregue esto a su plan de ejercicio de intensidad moderada.  Pasar menos tiempo sentados. Incluso la actividad fsica ligera puede ser beneficiosa. Controle sus niveles de colesterol y lpidos en la sangre Comience a realizarse anlisis de lpidos y colesterol en la sangre a los 20aos y luego reptalos cada 5aos. Hgase controlar los niveles de colesterol con mayor frecuencia si:  Sus niveles de lpidos y colesterol son altos.  Es mayor de 40aos.  Presenta un alto riesgo de padecer enfermedades cardacas. Qu debo saber sobre las pruebas de  deteccin del cncer? Segn su historia clnica y sus antecedentes familiares, es posible que deba realizarse pruebas de deteccin del cncer en diferentes edades. Esto puede incluir pruebas de deteccin de lo siguiente:  Cncer de mama.  Cncer de cuello uterino.  Cncer colorrectal.  Cncer de piel.  Cncer de pulmn. Qu debo saber sobre la enfermedad cardaca, la diabetes y la hipertensin arterial? Presin arterial y enfermedad cardaca  La hipertensin arterial causa enfermedades cardacas y aumenta el riesgo de accidente cerebrovascular. Es ms probable que esto se manifieste en las personas que tienen lecturas de presin arterial alta, tienen ascendencia africana o tienen sobrepeso.  Hgase controlar la presin arterial: ? Cada 3 a 5 aos si tiene entre 18 y 39 aos. ? Todos los aos si es mayor de 40aos. Diabetes Realcese exmenes de deteccin de la diabetes con regularidad. Este anlisis revisa el nivel de azcar en la sangre en ayunas. Hgase las pruebas de deteccin:  Cada tresaos despus de los 40aos de edad si tiene un peso normal y un bajo riesgo de padecer diabetes.  Con ms frecuencia y a partir de una edad inferior si tiene sobrepeso o un alto riesgo de padecer diabetes. Qu debo saber sobre la prevencin de infecciones? Hepatitis B Si tiene un riesgo ms alto de contraer hepatitis B, debe someterse a un examen de deteccin de este virus. Hable con el mdico para averiguar si tiene riesgo de contraer la infeccin por hepatitis B. Hepatitis C Se recomienda el anlisis a:  Todos los que nacieron entre 1945 y 1965.  Todas las personas que tengan un riesgo de haber contrado hepatitis C. Enfermedades de transmisin sexual (ETS)  Hgase las   pruebas de deteccin de ITS, incluidas la gonorrea y la clamidia, si: ? Es sexualmente activa y es menor de 24aos. ? Es mayor de 24aos, y el mdico le informa que corre riesgo de tener este tipo de infecciones. ?  La actividad sexual ha cambiado desde que le hicieron la ltima prueba de deteccin y tiene un riesgo mayor de tener clamidia o gonorrea. Pregntele al mdico si usted tiene riesgo.  Pregntele al mdico si usted tiene un alto riesgo de contraer VIH. El mdico tambin puede recomendarle un medicamento recetado para ayudar a evitar la infeccin por el VIH. Si elige tomar medicamentos para prevenir el VIH, primero debe hacerse los anlisis de deteccin del VIH. Luego debe hacerse anlisis cada 3meses mientras est tomando los medicamentos. Embarazo  Si est por dejar de menstruar (fase premenopusica) y usted puede quedar embarazada, busque asesoramiento antes de quedar embarazada.  Tome de 400 a 800microgramos (mcg) de cido flico todos los das si queda embarazada.  Pida mtodos de control de la natalidad (anticonceptivos) si desea evitar un embarazo no deseado. Osteoporosis y menopausia La osteoporosis es una enfermedad en la que los huesos pierden los minerales y la fuerza por el avance de la edad. El resultado pueden ser fracturas en los huesos. Si tiene 65aos o ms, o si est en riesgo de sufrir osteoporosis y fracturas, pregunte a su mdico si debe:  Hacerse pruebas de deteccin de prdida sea.  Tomar un suplemento de calcio o de vitamina D para reducir el riesgo de fracturas.  Recibir terapia de reemplazo hormonal (TRH) para tratar los sntomas de la menopausia. Siga estas instrucciones en su casa: Estilo de vida  No consuma ningn producto que contenga nicotina o tabaco, como cigarrillos, cigarrillos electrnicos y tabaco de mascar. Si necesita ayuda para dejar de fumar, consulte al mdico.  No consuma drogas.  No comparta agujas.  Solicite ayuda a su mdico si necesita apoyo o informacin para abandonar las drogas. Consumo de alcohol  No beba alcohol si: ? Su mdico le indica no hacerlo. ? Est embarazada, puede estar embarazada o est tratando de quedar embarazada.   Si bebe alcohol: ? Limite la cantidad que consume de 0 a 1 medida por da. ? Limite la ingesta si est amamantando.  Est atento a la cantidad de alcohol que hay en las bebidas que toma. En los [place], una medida equivale a una botella de cerveza de 12oz (355ml), un vaso de vino de 5oz (148ml) o un vaso de una bebida alcohlica de alta graduacin de 1oz (44ml). Instrucciones generales  Realcese los estudios de rutina de la salud, dentales y de la vista.  Mantngase al da con las vacunas.  Infrmele a su mdico si: ? Se siente deprimida con frecuencia. ? Alguna vez ha sido vctima de maltrato o no se siente segura en su casa. Resumen  Adoptar un estilo de vida saludable y recibir atencin preventiva son importantes para promover la salud y el bienestar.  Siga las instrucciones del mdico acerca de una dieta saludable, el ejercicio y la realizacin de pruebas o exmenes para detectar enfermedades.  Siga las instrucciones del mdico con respecto al control del colesterol y la presin arterial. Esta informacin no tiene como fin reemplazar el consejo del mdico. Asegrese de hacerle al mdico cualquier pregunta que tenga. Document Released: 10/20/2011 Document Revised: 11/21/2018 Document Reviewed: 11/21/2018 Elsevier Patient Education  2020 Elsevier Inc.  

## 2019-09-13 NOTE — Addendum Note (Signed)
Addended by: Bethanne Ginger on: 09/13/2019 12:14 PM   Modules accepted: Orders

## 2019-09-13 NOTE — BH Specialist Note (Signed)
Integrated Behavioral Health via Telemedicine Video Visit  09/13/2019 [place] [id number]  Number of [place] visits: 1 Session Start time: 9:27  Session End time: 9:48 Total time: 20  Referring Provider: Arlina Robes, MD Type of Visit: Video Patient/Family location: [facility] Provider location: [place] All persons participating in visit: Patient Nichole Soto, [place], and Spanish-speaking interpreter Olegario Shearer, [place]  Confirmed patient's address: Yes  Confirmed patient's phone number: Yes  Any changes to demographics: No   Confirmed patient's insurance: Yes  Any changes to patient's insurance: No   Discussed confidentiality: Yes   I connected with Fabian Sharp by a video enabled telemedicine application and verified that I am speaking with the correct person using two identifiers.     I discussed the limitations of evaluation and management by telemedicine and the availability of in person appointments.  I discussed that the purpose of this visit is to provide behavioral health care while limiting exposure to the novel coronavirus.   Discussed there is a possibility of technology failure and discussed alternative modes of communication if that failure occurs.  I discussed that engaging in this video visit, they consent to the provision of behavioral healthcare and the services will be billed under their insurance.  Patient and/or legal guardian expressed understanding and consented to video visit: Yes   PRESENTING CONCERNS: Patient and/or family reports the following symptoms/concerns: Pt states she has no particular concern today, feels she and her husband are coping well with their loss; pt agrees to set up [place] today to receive options for Spanish-speaking counselors, in case needed in the future.  Duration of problem: Less than one week; Severity of problem: mild  STRENGTHS (Protective Factors/Coping  Skills): -  GOALS ADDRESSED: Patient will: 1.  Increase knowledge and/or ability of: healthy habits  2.  Demonstrate ability to: Begin healthy grieving over loss  INTERVENTIONS: Interventions utilized:  Psychoeducation and/or Health Education and Link to [employer] Standardized Assessments completed: Not Needed  ASSESSMENT: Patient currently experiencing Grief.   Patient may benefit from psychoeducation and brief therapeutic interventions regarding coping with symptoms of grief .  PLAN: 1. Follow up with behavioral health clinician on : As needed 2. Behavioral recommendations:  -Set up MyChart today (with link sent to phone and [place], as needed) -Continue taking prenatal vitamin until postpartum visit 3. Referral(s): [place] (In Clinic) and Counseling options, as needed  I discussed the assessment and treatment plan with the patient and/or parent/guardian. They were provided an opportunity to ask questions and all were answered. They agreed with the plan and demonstrated an understanding of the instructions.   They were advised to call back or seek an in-person evaluation if the symptoms worsen or if the condition fails to improve as anticipated.  Caroleen Hamman Shann Merrick

## 2019-09-13 NOTE — Progress Notes (Signed)
Subjective:     Nichole Soto is a 27 y.o. female who presents for a postpartum visit. She is 3 weeks postpartum following a svd. I have fully reviewed the prenatal and intrapartum course. The delivery was at 20.0 gestational weeks. Outcome: spontaneous vaginal delivery. Anesthesia: IV sedation. Postpartum course has been N/A. Baby's course has been N/A. Baby is feeding by n/a. Bleeding no bleeding. Bowel function is normal. Bladder function is normal. Patient is not sexually active. Contraception method is none. Postpartum depression screening: positive.  The following portions of the patient's history were reviewed and updated as appropriate: allergies, current medications, past family history, past medical history, past social history, past surgical history and problem list.  Review of Systems Pertinent items noted in HPI and remainder of comprehensive ROS otherwise negative.   Objective:    BP 102/63   Pulse 74   Wt 128 lb (58.1 kg)   LMP 04/13/2019 (Approximate)   BMI 25.85 kg/m   General:  alert   Breasts:  not examined  Lungs: clear to auscultation bilaterally  Heart:  regular rate and rhythm, S1, S2 normal, no murmur, click, rub or gallop  Abdomen: soft, non-tender; bowel sounds normal; no masses,  no organomegaly   Vulva:  not evaluated  Vagina: not evaluated  Cervix:  not evaluated  Corpus: not examined  Adnexa:  not evaluated  Rectal Exam: Not performed.        Assessment:     NL postpartum exam. Pap smear not done at today's visit.  Advised to not attempt pregnancy for 6 months or 1 yr Plan:    1. Contraception: Depo-Provera injections 2. Return to ADL's as tolerates. Was seen by Carola Frost today. 3. Follow up in:q 12 weeks for Depo Provera or PRN

## 2019-09-23 ENCOUNTER — Ambulatory Visit (HOSPITAL_COMMUNITY): Payer: Self-pay

## 2019-10-01 ENCOUNTER — Telehealth (INDEPENDENT_AMBULATORY_CARE_PROVIDER_SITE_OTHER): Payer: Self-pay | Admitting: Lactation Services

## 2019-10-01 DIAGNOSIS — O039 Complete or unspecified spontaneous abortion without complication: Secondary | ICD-10-CM

## 2019-10-01 NOTE — Telephone Encounter (Signed)
Called pt with assistance of Eda Royal, Spanish Interpreter to let her know the results of her genetic screening on her miscarriage was a normal female. Pt voiced understanding.

## 2019-10-07 ENCOUNTER — Encounter: Payer: Self-pay | Admitting: *Deleted

## 2019-10-08 ENCOUNTER — Telehealth: Payer: Self-pay

## 2019-10-08 NOTE — Telephone Encounter (Signed)
Nichole Jude, MD  [facility]        Her chromosome tests from the pregnancy loss are normal.    Called pt with Spanish Interpreter Eda R., and informed pt results.  Pt verbalized understanding with no further questions.

## 2019-11-29 ENCOUNTER — Ambulatory Visit: Payer: Self-pay

## 2019-12-11 ENCOUNTER — Other Ambulatory Visit: Payer: Self-pay

## 2019-12-11 ENCOUNTER — Ambulatory Visit (INDEPENDENT_AMBULATORY_CARE_PROVIDER_SITE_OTHER): Payer: Self-pay

## 2019-12-11 ENCOUNTER — Ambulatory Visit (INDEPENDENT_AMBULATORY_CARE_PROVIDER_SITE_OTHER): Payer: Self-pay | Admitting: Clinical

## 2019-12-11 VITALS — BP 115/72 | HR 83 | Wt 141.0 lb

## 2019-12-11 DIAGNOSIS — F32A Depression, unspecified: Secondary | ICD-10-CM

## 2019-12-11 DIAGNOSIS — F329 Major depressive disorder, single episode, unspecified: Secondary | ICD-10-CM

## 2019-12-11 DIAGNOSIS — F4321 Adjustment disorder with depressed mood: Secondary | ICD-10-CM

## 2019-12-11 DIAGNOSIS — Z3042 Encounter for surveillance of injectable contraceptive: Secondary | ICD-10-CM

## 2019-12-11 MED ORDER — MEDROXYPROGESTERONE ACETATE 150 MG/ML IM SUSP
150.0000 mg | Freq: Once | INTRAMUSCULAR | Status: AC
  Administered 2019-12-11: 09:00:00 150 mg via INTRAMUSCULAR

## 2019-12-11 NOTE — BH Specialist Note (Signed)
  Integrated Behavioral Health Follow Up Visit  MRN: [id number] Name: Nichole Soto  Number of Integrated Behavioral Health Clinician visits: 2/6 Session Start time: 9:13  Session End time: 9:48 Total time: 35  Type of Service: Integrated Behavioral Health- Individual/Family Interpretor:Yes.   Interpretor Name and Language: Spanish: Juan  SUBJECTIVE: Nichole Soto is a 29 y.o. female accompanied by n/a Patient was referred by [place] Bing, MD for grief. Patient reports the following symptoms/concerns: Pt states she is grieving the loss of her daughter at [redacted]wks gestation, feeling sad, crying daily, difficulty sleeping, and feeling bad that she has not been able to spend time doing fun things with her 8yo son since her loss is overwhelming her emotions daily for the past 3 months, without any decrease.  Duration of problem: 3 months; Severity of problem: severe  OBJECTIVE: Mood: Depressed and Affect: Depressed Risk of harm to self or others: No plan to harm self or others  LIFE CONTEXT: Family and Social: Pt lives with 8yo son and husband School/Work: - Self-Care: - Life Changes: Recent loss at [employer] gestation  GOALS ADDRESSED: Patient will: 1.  Reduce symptoms of: depression  2.  Increase knowledge and/or ability of: healthy habits  3.  Demonstrate ability to: Continue healthy grieving over loss  INTERVENTIONS: Interventions utilized:  Supportive Counseling Standardized Assessments completed: Not Needed  ASSESSMENT: Patient currently experiencing Grief.   Patient may benefit from psychoeducation and brief therapeutic interventions regarding coping with symptoms of ongoing depression related to grief.  PLAN: 1. Follow up with behavioral health clinician on : One week 2. Behavioral recommendations:  -Begin taking medication as prescribed -Continue allowing self to grieve loss -Plan one fun activity with 8yo son this week -Contact [facility] [place] to set up initial appointment for ongoing therapy 3. Referral(s): Integrated [facility] [facility] (In Clinic) 4. "From scale of 1-10, how likely are you to follow plan?": -  Rae Lips, LCSW

## 2019-12-11 NOTE — Progress Notes (Signed)
Nichole Soto here for Depo-Provera  Injection.  Injection administered without complication. Patient will return in 3 months for next injection. With Spanish Interpreter Eda R., pt reports that this is going to be last her Depo Provera injection because she wants to try to conceive.  As I am getting pt's BP I observed the pt eyes as if she had something on her mind.  I ask pt if she had any thoughts or feelings that she wanted to share.  Pt begins to cry and informs me that she is having difficulty with loss of her baby that she delivered 5 months ago.  Notified Angelina Sheriff. [facility] and Asher Muir was able to speak with pt today to address her concern.    Ralene Bathe, RN 12/11/2019  8:55 AM

## 2019-12-11 NOTE — Patient Instructions (Signed)
              [facility]* Mon-Fri, 8:30am-12pm/1pm-2:30pm [address], [place], [place] [phone] [email or web address]  *Accepts Medicaid, sliding-scale*Bilingual services available  [facility] Mon-Fri, 8am-7pm [address] [place], [place]  [phone](WVPXT); [phone](fax) [email or web address] Medicaid *Bilingual services available  [facility] [address], [place], [place] [phone] [email or web address]  *Free & reduced services for uninsured and underinsured individuals *Bilingual services for Spanish-speaking clients 21 and under  [facility], [address] [place], [place] [phone](CVELF); [phone](fax) [employer].[email or web address]  *Bring your own interpreter at first visit *Accepts Medicare and [facility]  [facility]* [address], [place], [place]  [phone]  [email or web address]  *Bilingual services available

## 2019-12-12 MED ORDER — SERTRALINE HCL 50 MG PO TABS: 50.0000 mg | ORAL_TABLET | Freq: Every day | ORAL | 3 refills | Status: DC

## 2019-12-12 NOTE — Addendum Note (Signed)
Addended by: Catalina Antigua on: 12/12/2019 12:58 PM   Modules accepted: Orders

## 2019-12-16 NOTE — Progress Notes (Signed)
Patient seen and assessed by nursing staff during this encounter. I have reviewed the chart and agree with the documentation and plan.  Coben Godshall, MD 12/16/2019 3:47 PM   

## 2019-12-17 ENCOUNTER — Ambulatory Visit (INDEPENDENT_AMBULATORY_CARE_PROVIDER_SITE_OTHER): Payer: Self-pay | Admitting: Clinical

## 2019-12-17 DIAGNOSIS — F4321 Adjustment disorder with depressed mood: Secondary | ICD-10-CM

## 2019-12-17 NOTE — BH Specialist Note (Signed)
Integrated Behavioral Health via Telemedicine Phone Visit  12/17/2019 Nichole Soto Nichole Soto [phone]  Number of Integrated Behavioral Health visits: 3 Session Start time: 11:00  Session End time: 11:14 Total time: 14  Referring Provider: Roe Bing, MD Type of Visit: Phone Patient/Family location: Home [facility] Provider location: [facility] All persons participating in visit: Patient Nichole Soto and [facility] Nichole Soto and Spanish Interpreter Nichole Soto [phone]   Confirmed patient's address: Yes  Confirmed patient's phone number: Yes  Any changes to demographics: No   Confirmed patient's insurance: Yes  Any changes to patient's insurance: No   Discussed confidentiality: At previous visit  I connected with Nichole Soto  by a video enabled telemedicine application and verified that I am speaking with the correct person using two identifiers.     I discussed the limitations of evaluation and management by telemedicine and the availability of in person appointments.  I discussed that the purpose of this visit is to provide behavioral health care while limiting exposure to the novel coronavirus.   Discussed there is a possibility of technology failure and discussed alternative modes of communication if that failure occurs.  I discussed that engaging in this video visit, they consent to the provision of behavioral healthcare and the services will be billed under their insurance.  Patient and/or legal guardian expressed understanding and consented to video visit: Yes   PRESENTING CONCERNS: Patient and/or family reports the following symptoms/concerns: Pt states that she began taking Zoloft as prescribed one week ago, and has been "feeling happy" after her previous visit, decided to take son to the park, and has been "cleaning, exercising, and fix myself up" before picking up son from school, and is happy that her son is noticing a difference in her mood.   Duration of problem: 3 months; Severity of problem: moderate  STRENGTHS (Protective Factors/Coping Skills): Supportive family  GOALS ADDRESSED: Patient will: 1.  Reduce symptoms of: depression  2.  Demonstrate ability to: Continue healthy grieving over loss  INTERVENTIONS: Interventions utilized:  Supportive Counseling and Medication Monitoring Standardized Assessments completed: Not Needed  ASSESSMENT: Patient currently experiencing Grief.   Patient may benefit from continued psychoeducation and brief therapeutic interventions regarding coping with symptoms of depression after loss .  PLAN: 1. Follow up with behavioral health clinician on : One month 2. Behavioral recommendations:  -Continue taking Zoloft as prescribed -Continue planning weekly activities with son 3. Referral(s): Integrated [employer] (In Clinic)  I discussed the assessment and treatment plan with the patient and/or parent/guardian. They were provided an opportunity to ask questions and all were answered. They agreed with the plan and demonstrated an understanding of the instructions.   They were advised to call back or seek an in-person evaluation if the symptoms worsen or if the condition fails to improve as anticipated.  Nichole Soto Nichole Soto

## 2019-12-24 ENCOUNTER — Ambulatory Visit: Payer: Self-pay

## 2020-01-14 ENCOUNTER — Ambulatory Visit: Payer: Self-pay | Admitting: Clinical

## 2020-01-14 ENCOUNTER — Other Ambulatory Visit: Payer: Self-pay

## 2020-01-14 NOTE — BH Specialist Note (Signed)
Pt did not arrive to video visit and did not answer the phone ; Unable to leave a voice message with no voicemail set up.

## 2020-05-17 ENCOUNTER — Other Ambulatory Visit: Payer: Self-pay

## 2020-05-17 ENCOUNTER — Inpatient Hospital Stay (HOSPITAL_COMMUNITY)
Admission: AD | Admit: 2020-05-17 | Discharge: 2020-05-17 | Disposition: A | Discharge status: Home / Self Care | Payer: Self-pay | Attending: Family Medicine | Admitting: Family Medicine

## 2020-05-17 ENCOUNTER — Encounter (HOSPITAL_COMMUNITY): Payer: Self-pay | Admitting: Family Medicine

## 2020-05-17 ENCOUNTER — Inpatient Hospital Stay (HOSPITAL_COMMUNITY): Payer: Self-pay

## 2020-05-17 DIAGNOSIS — Z79899 Other long term (current) drug therapy: Secondary | ICD-10-CM | POA: Insufficient documentation

## 2020-05-17 DIAGNOSIS — G8911 Acute pain due to trauma: Secondary | ICD-10-CM | POA: Insufficient documentation

## 2020-05-17 DIAGNOSIS — O09292 Supervision of pregnancy with other poor reproductive or obstetric history, second trimester: Secondary | ICD-10-CM | POA: Insufficient documentation

## 2020-05-17 DIAGNOSIS — Z3A01 Less than 8 weeks gestation of pregnancy: Secondary | ICD-10-CM

## 2020-05-17 DIAGNOSIS — O26899 Other specified pregnancy related conditions, unspecified trimester: Secondary | ICD-10-CM

## 2020-05-17 DIAGNOSIS — O26891 Other specified pregnancy related conditions, first trimester: Secondary | ICD-10-CM

## 2020-05-17 DIAGNOSIS — Z3491 Encounter for supervision of normal pregnancy, unspecified, first trimester: Secondary | ICD-10-CM

## 2020-05-17 DIAGNOSIS — Z791 Long term (current) use of non-steroidal anti-inflammatories (NSAID): Secondary | ICD-10-CM | POA: Insufficient documentation

## 2020-05-17 DIAGNOSIS — Q513 Bicornate uterus: Secondary | ICD-10-CM

## 2020-05-17 DIAGNOSIS — R109 Unspecified abdominal pain: Secondary | ICD-10-CM

## 2020-05-17 DIAGNOSIS — O99891 Other specified diseases and conditions complicating pregnancy: Secondary | ICD-10-CM | POA: Insufficient documentation

## 2020-05-17 DIAGNOSIS — O3401 Maternal care for unspecified congenital malformation of uterus, first trimester: Secondary | ICD-10-CM

## 2020-05-17 DIAGNOSIS — M79602 Pain in left arm: Secondary | ICD-10-CM | POA: Insufficient documentation

## 2020-05-17 DIAGNOSIS — Z8759 Personal history of other complications of pregnancy, childbirth and the puerperium: Secondary | ICD-10-CM | POA: Insufficient documentation

## 2020-05-17 LAB — URINALYSIS, ROUTINE W REFLEX MICROSCOPIC
Bilirubin Urine: NEGATIVE
Glucose, UA: NEGATIVE mg/dL
Hgb urine dipstick: NEGATIVE
Ketones, ur: 5 mg/dL — AB
Leukocytes,Ua: NEGATIVE
Nitrite: NEGATIVE
Protein, ur: NEGATIVE mg/dL
Specific Gravity, Urine: 1.017 (ref 1.005–1.030)
pH: 6 (ref 5.0–8.0)

## 2020-05-17 LAB — CBC
HCT: 34.2 % — ABNORMAL LOW (ref 36.0–46.0)
Hemoglobin: 11.2 g/dL — ABNORMAL LOW (ref 12.0–15.0)
MCH: 28.3 pg (ref 26.0–34.0)
MCHC: 32.7 g/dL (ref 30.0–36.0)
MCV: 86.4 fL (ref 80.0–100.0)
Platelets: 275 10*3/uL (ref 150–400)
RBC: 3.96 MIL/uL (ref 3.87–5.11)
RDW: 13.7 % (ref 11.5–15.5)
WBC: 6.3 10*3/uL (ref 4.0–10.5)
nRBC: 0 % (ref 0.0–0.2)

## 2020-05-17 LAB — WET PREP, GENITAL
Clue Cells Wet Prep HPF POC: NONE SEEN
Sperm: NONE SEEN
Trich, Wet Prep: NONE SEEN
Yeast Wet Prep HPF POC: NONE SEEN

## 2020-05-17 LAB — HCG, QUANTITATIVE, PREGNANCY: hCG, Beta Chain, Quant, S: 9372 m[IU]/mL — ABNORMAL HIGH (ref <5)

## 2020-05-17 LAB — POCT PREGNANCY, URINE: Preg Test, Ur: POSITIVE — AB
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
## 2020-05-17 NOTE — Discharge Instructions (Signed)
Dolor abdominal durante el embarazo Abdominal Pain During Pregnancy  El dolor de vientre (abdominal) es habitual durante el embarazo. Hay muchas causas posibles. Generalmente, no se trata de un problema grave. Otras veces puede ser un signo de que algo no anda bien. Siempre comunquese con su mdico si tiene dolor abdominal. Siga estas indicaciones en su casa:  No tenga relaciones sexuales ni se coloque nada dentro de la vagina hasta que el dolor haya desaparecido completamente.  Descanse todo lo que pueda hasta que el dolor se le haya calmado.  Beba suficiente lquido como para mantener el pis (orina) de color amarillo plido.  Tome los medicamentos de venta libre y los recetados solamente como se lo haya indicado el mdico.  Concurra a todas las visitas de control como se lo haya indicado el mdico. Esto es importante. Comunquese con un mdico si:  El dolor contina o empeora despus de descansar.  Siente dolor en la parte inferior del abdomen que: ? Va y viene en intervalos regulares. ? Se extiende a la espalda. ? Se siente como clicos menstruales.  Siente dolor o ardor al orinar. Solicite ayuda de inmediato si:  Tiene fiebre o siente escalofros.  Tiene una hemorragia vaginal abundante.  Tiene una prdida de lquido por la vagina.  Elimina tejidos por la vagina.  Vomita durante ms de 24horas.  Tiene heces lquidas (diarrea) durante ms de 24horas.  El beb se mueve menos de lo habitual.  Se siente dbil o se desmaya.  Le falta el aire.  Tiene dolor muy intenso en la parte superior del abdomen. Resumen  El dolor de vientre (abdominal) es habitual durante el embarazo. Hay muchas causas posibles.  Si tiene dolor en el abdomen durante el embarazo, avise al mdico de inmediato.  Concurra a todas las visitas de control como se lo haya indicado el mdico. Esto es importante. Esta informacin no tiene como fin reemplazar el consejo del mdico. Asegrese de  hacerle al mdico cualquier pregunta que tenga. Document Revised: 04/25/2017 Document Reviewed: 04/25/2017 Elsevier Patient Education  2020 Elsevier Inc.  
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
## 2020-05-19 LAB — GC/CHLAMYDIA PROBE AMP (~~LOC~~) NOT AT ARMC
Chlamydia: NEGATIVE
Comment: NEGATIVE
Comment: NORMAL
Neisseria Gonorrhea: NEGATIVE

## 2020-06-18 ENCOUNTER — Encounter: Payer: Self-pay | Admitting: *Deleted

## 2020-06-18 ENCOUNTER — Other Ambulatory Visit (HOSPITAL_COMMUNITY)
Admission: RE | Admit: 2020-06-18 | Discharge: 2020-06-18 | Disposition: A | Discharge status: Home / Self Care | Payer: Self-pay | Source: Ambulatory Visit

## 2020-06-18 ENCOUNTER — Ambulatory Visit (INDEPENDENT_AMBULATORY_CARE_PROVIDER_SITE_OTHER): Payer: Self-pay

## 2020-06-18 ENCOUNTER — Other Ambulatory Visit: Payer: Self-pay

## 2020-06-18 VITALS — BP 119/71 | HR 81 | Wt 142.8 lb

## 2020-06-18 DIAGNOSIS — O0990 Supervision of high risk pregnancy, unspecified, unspecified trimester: Secondary | ICD-10-CM | POA: Insufficient documentation

## 2020-06-18 DIAGNOSIS — N898 Other specified noninflammatory disorders of vagina: Secondary | ICD-10-CM | POA: Insufficient documentation

## 2020-06-18 DIAGNOSIS — O09291 Supervision of pregnancy with other poor reproductive or obstetric history, first trimester: Secondary | ICD-10-CM

## 2020-06-18 DIAGNOSIS — Z789 Other specified health status: Secondary | ICD-10-CM

## 2020-06-18 DIAGNOSIS — O26899 Other specified pregnancy related conditions, unspecified trimester: Secondary | ICD-10-CM

## 2020-06-18 DIAGNOSIS — O26891 Other specified pregnancy related conditions, first trimester: Secondary | ICD-10-CM | POA: Insufficient documentation

## 2020-06-18 DIAGNOSIS — R109 Unspecified abdominal pain: Secondary | ICD-10-CM

## 2020-06-18 DIAGNOSIS — O368390 Maternal care for abnormalities of the fetal heart rate or rhythm, unspecified trimester, not applicable or unspecified: Secondary | ICD-10-CM

## 2020-06-18 DIAGNOSIS — Q513 Bicornate uterus: Secondary | ICD-10-CM

## 2020-06-18 LAB — POCT URINALYSIS DIP (DEVICE)
Bilirubin Urine: NEGATIVE
Glucose, UA: NEGATIVE mg/dL
Hgb urine dipstick: NEGATIVE
Ketones, ur: NEGATIVE mg/dL
Leukocytes,Ua: NEGATIVE
Nitrite: NEGATIVE
Protein, ur: NEGATIVE mg/dL
Specific Gravity, Urine: 1.025 (ref 1.005–1.030)
Urobilinogen, UA: 0.2 mg/dL (ref 0.0–1.0)
pH: 7 (ref 5.0–8.0)
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
## 2020-06-18 NOTE — Progress Notes (Signed)
Informal bedside US for FHR - 157 bpm per M-mode. Fetal movement observed.

## 2020-06-18 NOTE — Progress Notes (Signed)
Subjective:   Nichole Soto is a 29 y.o. G3P1011 at [redacted]w[redacted]d by LMP being seen today for her first obstetrical visit.    Gynecological/Obstetrical History: Her obstetrical history is significant for history of C/S, Bicornuate uterus, 2nd trimester (20wks) pregnancy loss in Oct 2020 after pPROM. Patient does intend to breast feed. Pregnancy history fully reviewed. Patient reports vaginal discharge.  Sexual Activity and Vaginal Concerns: Patient reports lower abdominal pain that she describes as cramping and started a few days ago.  She reports onset of black discharge today with wiping. She reports sexual activity in the past 3 days, but denies pain or discomfort.  Patient also denies pain or discomfort with urination or constipation/diarrhea.  Medical History/ROS: Denies  Social History: Patient denies prior or current usage of tobacco, alcohol, or drugs.  Patient reports the FOB is Druscilla Brownie who is supportive, but they are not in a relationship.  Patient reports that she lives with with her child 29 and endorses safety at home.  Patient denies DV/A and denies having good social support. Patient is not currently employed.  HISTORY: OB History  Gravida Para Term Preterm AB Living  3 2 1  0 0 1  SAB TAB Ectopic Multiple Live Births  0 0 0 0 2    # Outcome Date GA Lbr Len/2nd Weight Sex Delivery Anes PTL Lv  3 Current           2 Para 08/31/19 [redacted]w[redacted]d 00:45  F Vag-Spont None  ND     Birth Comments: est 20 weeks      Name: JUAN ANDRES,GIRL Preslea     Apgar1: 4  Apgar5: 2  1 Term 08/10/11   7 lb 8 oz (3.402 kg) M CS-Unspec EPI  LIV     Complications: Malpresentation of fetus    Obstetric Comments  G1: 2012, term c/s for transverse. Pt never told she couldn't labor in the future.     Last pap smear was done Sept 2020 and was normal  Past Medical History:  Diagnosis Date  . Abnormal shape or position of gravid uterus and adnexa, antepartum 07/29/2019   Bicornuate  vs septate on u/s  Viable twin in right horn/side  . Inevitable abortion 08/31/2019  . Medical history non-contributory   . Preterm premature rupture of membranes 08/27/2019   Dx on 10/12 with mfm anatomy u/s  . Supervision of high risk pregnancy, antepartum 07/29/2019    Nursing Staff Provider Office Location  [facility] Dating   Language  Spanish Anatomy 07/31/2019   Flu Vaccine  08/08/2019 Genetic Screen  NIPS:   AFP:   First Screen:  Quad:   TDaP vaccine    Hgb A1C or  GTT Early: 5.3  Third trimester  Rhogam  n/a   LAB RESULTS  Feeding Plan Breast Blood Type O/Positive/-- (09/24 1152)  Contraception Depo Antibody Negative (09/24 1152) Circumcision Undecided Rubella 2.93   . Threatened miscarriage 07/30/2019  . Vaginal bleeding in pregnancy, second trimester 08/16/2019  . Vaginal discharge 07/30/2019  . Vaginal spotting 07/30/2019  . 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 CESAREAN SECTION     History reviewed. No pertinent family history. Social History   Tobacco Use  . Smoking status: Never Smoker  . Smokeless tobacco: Never Used  Vaping Use  . Vaping Use: Never used  Substance Use Topics  .  Alcohol use: Not Currently  . Drug use: Never   No Known Allergies Current Outpatient Medications on File Prior to Visit  Medication Sig Dispense Refill  . Prenatal Vit-Fe Fumarate-FA (PRENATAL VITAMINS PO) Take 1 tablet by mouth daily.    . Prenatal Vit-Fe Fumarate-FA (PREPLUS) 27-1 MG TABS Take 1 tablet by mouth daily. (Patient not taking: Reported on 12/11/2019) 30 tablet 13  . sertraline (ZOLOFT) 50 MG tablet Take 1 tablet (50 mg total) by mouth daily. 30 tablet 3   No current facility-administered medications on file prior to visit.    Review of Systems Pertinent items noted in HPI and remainder of comprehensive ROS otherwise negative.  Exam   Vitals:   06/18/20 1333  BP: 119/71  Pulse:  81  Weight: 142 lb 12.8 oz (64.8 kg)      Physical Exam Constitutional:      Appearance: Normal appearance.  Genitourinary:     No vulval ulcerations noted.     Vaginal discharge present.     No vaginal tenderness, bleeding or atrophic mucosa.     Cervical discharge and pinkness present.     No cervical motion tenderness, friability, erythema, bleeding, polyp or nabothian cyst.     Genitourinary Comments: Speculum Exam: -Normal External Genitalia: Non tender, no apparent discharge at introitus.  -Vaginal Vault: Pink mucosa with good rugae. Scant amt dark brown discharge vs blood.  Removed with faux swab x 2. No clots noted. No apparent odor. -CV collected -Cervix:Pink, no lesions, cysts, or polyps.  Appears closed. No active bleeding from os, but brownish mucoid discharge noted- -Bimanual Exam:  No CMT, Uterine size and position not assessed d/t body habitus.    HENT:     Head: Normocephalic and atraumatic.  Eyes:     Conjunctiva/sclera: Conjunctivae normal.  Neck:     Thyroid: No thyroid mass, thyromegaly or thyroid tenderness.  Cardiovascular:     Rate and Rhythm: Normal rate and regular rhythm.     Heart sounds: Normal heart sounds.  Pulmonary:     Effort: Pulmonary effort is normal. No respiratory distress.     Breath sounds: Normal breath sounds.  Musculoskeletal:        General: Normal range of motion.     Cervical back: Normal range of motion. No tenderness.  Neurological:     Mental Status: She is alert and oriented to person, place, and time.  Skin:    General: Skin is warm and dry.  Psychiatric:        Mood and Affect: Mood normal.        Thought Content: Thought content normal.        Judgment: Judgment normal.     Assessment:   29 y.o. year old G3P1001 Patient Active Problem List   Diagnosis Date Noted  . Postpartum care following vaginal delivery 09/13/2019  . Contraception management 09/13/2019  . History of malpresentation 07/30/2019  . History of  cesarean delivery 07/30/2019  . Language barrier 07/29/2019     Plan:  1. Supervision of high risk pregnancy, antepartum -Anticipatory guidance for upcoming appts. -Congratulations given and patient welcomed to practice. -Discussed usage of Babyscripts.   *Instructed to take blood pressure and record weekly into babyscripts. -Anticipatory guidance for prenatal visits including labs, ultrasounds, and testing; Initial labs drawn. -Genetic Screening discussed, First trimester screen, Quad screen and NIPS: declined. -Encouraged to complete MyChart Registration for her ability to review results, send requests, and have questions addressed.  -Discussed estimated due date  of January 12, 2021 based on LMP. -Ultrasound discussed; fetal anatomic survey: discussed, will schedule at next visit. -Encouraged to seek out care at office or emergency room for urgent and/or emergent concerns. -Educated on the nature of [facility] with multiple MDs and other Advanced Practice Providers was explained to patient; also emphasized that residents, students are part of our team. Informed of her right to refuse care as she deems appropriate.  -No questions or concerns.   2. History of pregnancy loss in prior pregnancy, currently pregnant in first trimester -Initiate bASA at next visit.  3. Ultrasound scan done for inability to hear fetal heart tones -BSUS confirms IUP with FHR at 152  4. Vaginal discharge during pregnancy in first trimester -CV collected -STD testing <1 month ago and negative  5. Bicornuate uterus -Pregnancy in right horn.  6. Language barrier -In person Spanish Interpreter present: Eda  7. Abdominal pain during pregnancy, antepartum -Encouraged to increase hydration. -Report worsening symptoms. -Reviewed safety of tylenol.    Problem list reviewed and updated. Routine obstetric precautions reviewed.  No orders of the defined types were placed in  this encounter.   Return in about 4 weeks (around 07/16/2020) for HROB .    Cherre Robins, CNM 06/18/2020 2:06 PM

## 2020-06-19 LAB — CBC/D/PLT+RPR+RH+ABO+RUB AB...
Antibody Screen: NEGATIVE
Basophils Absolute: 0 10*3/uL (ref 0.0–0.2)
Basos: 1 %
EOS (ABSOLUTE): 0.2 10*3/uL (ref 0.0–0.4)
Eos: 3 %
HCV Ab: <0.1 s/co ratio (ref 0.0–0.9)
HIV Screen 4th Generation wRfx: NONREACTIVE
Hematocrit: 36.4 % (ref 34.0–46.6)
Hemoglobin: 12.3 g/dL (ref 11.1–15.9)
Hepatitis B Surface Ag: NEGATIVE
Immature Grans (Abs): 0 10*3/uL (ref 0.0–0.1)
Immature Granulocytes: 0 %
Lymphocytes Absolute: 2.1 10*3/uL (ref 0.7–3.1)
Lymphs: 28 %
MCH: 28.5 pg (ref 26.6–33.0)
MCHC: 33.8 g/dL (ref 31.5–35.7)
MCV: 84 fL (ref 79–97)
Monocytes Absolute: 0.5 10*3/uL (ref 0.1–0.9)
Monocytes: 7 %
Neutrophils Absolute: 4.6 10*3/uL (ref 1.4–7.0)
Neutrophils: 61 %
Platelets: 276 10*3/uL (ref 150–450)
RBC: 4.32 x10E6/uL (ref 3.77–5.28)
RDW: 13.2 % (ref 11.7–15.4)
RPR Ser Ql: NONREACTIVE
Rh Factor: POSITIVE
Rubella Antibodies, IGG: 2.98 index (ref >0.99)
WBC: 7.6 10*3/uL (ref 3.4–10.8)

## 2020-06-19 LAB — CERVICOVAGINAL ANCILLARY ONLY
Bacterial Vaginitis (gardnerella): NEGATIVE
Candida Glabrata: NEGATIVE
Candida Vaginitis: NEGATIVE
Comment: NEGATIVE
Comment: NEGATIVE
Comment: NEGATIVE

## 2020-06-19 LAB — HCV INTERPRETATION

## 2020-06-20 LAB — CULTURE, OB URINE

## 2020-06-20 LAB — URINE CULTURE, OB REFLEX

## 2020-06-26 ENCOUNTER — Telehealth: Payer: Self-pay | Admitting: General Practice

## 2020-06-26 NOTE — Telephone Encounter (Signed)
Nichole Soto DOB: [birth year] MRN: [id number]   RIDER WAIVER AND RELEASE OF LIABILITY  For purposes of improving physical access to our facilities, [facility] is pleased to partner with third parties to provide [facility] patients or other authorized individuals the option of convenient, on-demand ground transportation services (the [employer]) through use of the technology service that enables users to request on-demand ground transportation from independent third-party providers.  By opting to use and accept these [employer], I, the undersigned, hereby agree on behalf of myself, and on behalf of any minor child using the [employer] for whom I am the parent or legal guardian, as follows:  1. Science writer provided to me are provided by independent third-party transportation providers who are not [employer] or employees and who are unaffiliated with [employer]. 2. [facility] is neither a transportation carrier nor a common or public carrier. 3. [facility] has no control over the quality or safety of the transportation that occurs as a result of the [employer]. 4. [facility] cannot guarantee that any third-party transportation provider will complete any arranged transportation service. 5. [facility] makes no representation, warranty, or guarantee regarding the reliability, timeliness, quality, safety, suitability, or availability of any of the Transport Services or that they will be error free. 6. I fully understand that traveling by vehicle involves risks and dangers of serious bodily injury, including permanent disability, paralysis, and death. I agree, on behalf of myself and on behalf of any minor child using the Transport Services for whom I am the parent or legal guardian, that the entire risk arising out of my use of the [employer] remains solely with me, to the maximum extent permitted under applicable law. 7. The  [employer] are provided as is and as available. [facility] disclaims all representations and warranties, express, implied or statutory, not expressly set out in these terms, including the implied warranties of merchantability and fitness for a particular purpose. 8. I hereby waive and release [facility], its agents, employees, officers, directors, representatives, insurers, attorneys, assigns, successors, subsidiaries, and affiliates from any and all past, present, or future claims, demands, liabilities, actions, causes of action, or suits of any kind directly or indirectly arising from acceptance and use of the [employer]. 9. I further waive and release [facility] and its affiliates from all present and future liability and responsibility for any injury or death to persons or damages to property caused by or related to the use of the [employer]. 10. I have read this Waiver and Release of Liability, and I understand the terms used in it and their legal significance. This Waiver is freely and voluntarily given with the understanding that my right (as well as the right of any minor child for whom I am the parent or legal guardian using the [employer]) to legal recourse against [facility] in connection with the [employer] is knowingly surrendered in return for use of these services.   I attest that I read the consent document to [name], gave Ms. [name] the opportunity to ask questions and answered the questions asked (if any). I affirm that [name] then provided consent for she's participation in this program.     Launa Grill

## 2020-06-30 ENCOUNTER — Encounter (HOSPITAL_COMMUNITY): Payer: Self-pay | Admitting: Emergency Medicine

## 2020-06-30 ENCOUNTER — Other Ambulatory Visit: Payer: Self-pay

## 2020-06-30 ENCOUNTER — Inpatient Hospital Stay (HOSPITAL_COMMUNITY)
Admission: EM | Admit: 2020-06-30 | Discharge: 2020-06-30 | Disposition: A | Discharge status: Home / Self Care | Payer: Self-pay | Attending: Family Medicine | Admitting: Family Medicine

## 2020-06-30 DIAGNOSIS — O34219 Maternal care for unspecified type scar from previous cesarean delivery: Secondary | ICD-10-CM | POA: Insufficient documentation

## 2020-06-30 DIAGNOSIS — T5991XA Toxic effect of unspecified gases, fumes and vapors, accidental (unintentional), initial encounter: Secondary | ICD-10-CM

## 2020-06-30 DIAGNOSIS — Z8759 Personal history of other complications of pregnancy, childbirth and the puerperium: Secondary | ICD-10-CM | POA: Insufficient documentation

## 2020-06-30 DIAGNOSIS — Z79899 Other long term (current) drug therapy: Secondary | ICD-10-CM | POA: Insufficient documentation

## 2020-06-30 DIAGNOSIS — O09291 Supervision of pregnancy with other poor reproductive or obstetric history, first trimester: Secondary | ICD-10-CM | POA: Insufficient documentation

## 2020-06-30 DIAGNOSIS — O99611 Diseases of the digestive system complicating pregnancy, first trimester: Secondary | ICD-10-CM | POA: Insufficient documentation

## 2020-06-30 DIAGNOSIS — Z3A12 12 weeks gestation of pregnancy: Secondary | ICD-10-CM | POA: Insufficient documentation

## 2020-06-30 DIAGNOSIS — K219 Gastro-esophageal reflux disease without esophagitis: Secondary | ICD-10-CM | POA: Insufficient documentation

## 2020-06-30 LAB — URINALYSIS, ROUTINE W REFLEX MICROSCOPIC
Bilirubin Urine: NEGATIVE
Glucose, UA: NEGATIVE mg/dL
Hgb urine dipstick: NEGATIVE
Ketones, ur: 5 mg/dL — AB
Leukocytes,Ua: NEGATIVE
Nitrite: NEGATIVE
Protein, ur: NEGATIVE mg/dL
Specific Gravity, Urine: 1.011 (ref 1.005–1.030)
pH: 6 (ref 5.0–8.0)

## 2020-06-30 MED ORDER — FAMOTIDINE 40 MG PO TABS
40.0000 mg | ORAL_TABLET | Freq: Every day | ORAL | 11 refills | Status: DC
Start: 2020-06-30 — End: 2020-07-15

## 2020-06-30 MED ORDER — LIDOCAINE VISCOUS HCL 2 % MT SOLN
15.0000 mL | Freq: Once | OROMUCOSAL | Status: AC
  Administered 2020-06-30: 15 mL via ORAL
  Filled 2020-06-30: qty 15

## 2020-06-30 MED ORDER — DICYCLOMINE HCL 10 MG/5ML PO SOLN
10.0000 mg | Freq: Once | ORAL | Status: AC
  Administered 2020-06-30: 10 mg via ORAL
  Filled 2020-06-30: qty 5

## 2020-06-30 MED ORDER — ALUM & MAG HYDROXIDE-SIMETH 200-200-20 MG/5ML PO SUSP
30.0000 mL | Freq: Once | ORAL | Status: AC
  Administered 2020-06-30: 30 mL via ORAL
  Filled 2020-06-30: qty 30

## 2020-06-30 NOTE — ED Triage Notes (Signed)
Patient reports cough after inhaling insecticide spray. Patient in NAD

## 2020-06-30 NOTE — MAU Note (Signed)
Pt reports to MAU stating she was spraying roach spray and she got intoxicated. Pt reports she got dizzy, nausea, upper abdomen pain, and a bitter taste in her mouth. Pt denies vaginal bleeding or discharge.

## 2020-06-30 NOTE — MAU Note (Signed)
RN called poison control regarding patient inhaling the roach spray.  Poison control representative stated no harm would come to the baby from this episode. Recommended supportive care for patient -- steam therapy sitting in bathroom running  shower for 30-40 minutes.   This information was relayed to the provider.

## 2020-06-30 NOTE — MAU Provider Note (Signed)
History     CSN: [id number]  Arrival date and time: 06/30/20 2010   First Provider Initiated Contact with Patient 06/30/20 2144      Chief Complaint  Patient presents with  . Abdominal Pain   Nichole Soto is a 29 y.o. G3P1001 at [redacted]w[redacted]d who presents today with upper abdominal pain that started this morning after using roach spray. She states that she sprayed about a 12 foot area of floor and the drawers etc in her kitchen. After she used the spray she started to get upper abdominal pain. She also has cramping. She denies any vaginal bleeding or LOF. She had an [place] with IUP noted on 05/17/2020  Abdominal Pain This is a new problem. The current episode started today. The problem occurs constantly. The pain is located in the epigastric region. The pain is at a severity of 10/10. The quality of the pain is burning. Associated symptoms include nausea (has been ongoing with the pregnancy ). Pertinent negatives include no dysuria, fever or vomiting. Nothing aggravates the pain. The pain is relieved by nothing. She has tried nothing for the symptoms.    OB History    Gravida  3   Para  2   Term  1   Preterm      AB      Living  1     SAB      TAB      Ectopic      Multiple  0   Live Births  2        Obstetric Comments  G1: 2012, term c/s for transverse. Pt never told she couldn't labor in the future.         Past Medical History:  Diagnosis Date  . 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 Inevitable abortion 08/31/2019  . Medical history non-contributory   . Preterm premature rupture of membranes 08/27/2019   Dx on 10/12 with mfm anatomy u/s  . Supervision of high risk pregnancy, antepartum 07/29/2019    Nursing Staff Provider Office Location  [facility] Dating   Language  Spanish Anatomy US   Flu Vaccine  08/08/2019 Genetic Screen  NIPS:   AFP:   First  Screen:  Quad:   TDaP vaccine    Hgb A1C or  GTT Early: 5.3  Third trimester  Rhogam  n/a   LAB RESULTS  Feeding Plan Breast Blood Type O/Positive/-- (09/24 1152)  Contraception Depo Antibody Negative (09/24 1152) Circumcision Undecided Rubella 2.93   . Threatened miscarriage 07/30/2019  . Vaginal bleeding in pregnancy, second trimester 08/16/2019  . Vaginal discharge 07/30/2019  . Vaginal spotting 07/30/2019  . 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 CESAREAN SECTION      History reviewed. No pertinent family history.  Social History   Tobacco Use  . Smoking status: Never Smoker  . Smokeless tobacco: Never Used  Vaping Use  . Vaping Use: Never used  Substance Use Topics  . Alcohol use: Not Currently  . Drug use: Never    Allergies: No Known Allergies  Medications Prior to Admission  Medication Sig Dispense Refill Last Dose  . Prenatal Vit-Fe Fumarate-FA (PRENATAL VITAMINS PO) Take 1 tablet by mouth daily.   06/30/2020 at Unknown time  .  Prenatal Vit-Fe Fumarate-FA (PREPLUS) 27-1 MG TABS Take 1 tablet by mouth daily. (Patient not taking: Reported on 12/11/2019) 30 tablet 13   . sertraline (ZOLOFT) 50 MG tablet Take 1 tablet (50 mg total) by mouth daily. 30 tablet 3     Review of Systems  Constitutional: Negative for chills and fever.  Gastrointestinal: Positive for abdominal pain and nausea (has been ongoing with the pregnancy ). Negative for vomiting.  Genitourinary: Negative for dysuria, pelvic pain, vaginal bleeding and vaginal discharge.   Physical Exam   Blood pressure 119/67, pulse 82, temperature 98.4 F (36.9 C), temperature source Oral, resp. rate 20, height 4\' 9"  (1.448 m), weight 64.8 kg, last menstrual period 04/07/2020, SpO2 100 %, unknown if currently breastfeeding.  Physical Exam Vitals and nursing note reviewed. Exam conducted with a chaperone present.   Constitutional:      General: She is not in acute distress. HENT:     Head: Normocephalic.  Cardiovascular:     Rate and Rhythm: Normal rate.  Pulmonary:     Effort: Pulmonary effort is normal.  Abdominal:     Tenderness: There is no abdominal tenderness.  Skin:    General: Skin is warm and dry.  Neurological:     Mental Status: She is alert and oriented to person, place, and time.  Psychiatric:        Mood and Affect: Mood normal.        Behavior: Behavior normal.    +FHT 150 bpm with doppler   Results for orders placed or performed during the hospital encounter of 06/30/20 (from the past 24 hour(s))  Urinalysis, Routine w reflex microscopic Urine, Clean Catch     Status: Abnormal   Collection Time: 06/30/20  9:01 PM  Result Value Ref Range   Color, Urine YELLOW YELLOW   APPearance CLEAR CLEAR   Specific Gravity, Urine 1.011 1.005 - 1.030   pH 6.0 5.0 - 8.0   Glucose, UA NEGATIVE NEGATIVE mg/dL   Hgb urine dipstick NEGATIVE NEGATIVE   Bilirubin Urine NEGATIVE NEGATIVE   Ketones, ur 5 (A) NEGATIVE mg/dL   Protein, ur NEGATIVE NEGATIVE mg/dL   Nitrite NEGATIVE NEGATIVE   Leukocytes,Ua NEGATIVE NEGATIVE     MAU Course  Procedures  MDM  Spoke with poison control. Patient would have had to drink the bug spray for it to cause problems. With inhalation patient can do steam treatments at home for 30 mins in a steamy shower to help with throat pain.    Patient has had GI cocktail and reports that her pain has improved.   Assessment and Plan   1. Gastroesophageal reflux disease, unspecified whether esophagitis present   2. Inhalation of noxious fumes, accidental or unintentional, initial encounter   3. [redacted] weeks gestation of pregnancy    DC home Comfort measures reviewed  1st/2nd Trimester precautions  RX: pepcid 40mg  QHS #30 with 11 RF  Return to MAU as needed FU with OB as planned   Follow-up Information    [facility] Follow up.   Specialty: Obstetrics and Gynecology Contact information: [address] [place] [address] [phone]                [id number] DNP, CNM  06/30/20  9:52 PM

## 2020-06-30 NOTE — MAU Note (Signed)
Pt reports the pain she has tonight is different from the pain that keeps her awake every night.

## 2020-06-30 NOTE — Discharge Instructions (Signed)
Lesin por inhalacin de sustancias qumicas en los Scientist, product/process development Injury, Adult Una lesin por inhalacin de sustancias qumicas ocurre cuando una persona respira (inhala) gases o partculas de sustancias qumicas que daan la garganta, las vas respiratorias o los pulmones (aparato respiratorio). Las lesiones por la inhalacin de sustancias qumicas ocurren con mayor frecuencia en estos casos:  Durante los incendios, cuando los materiales que se queman liberan sustancias qumicas en el [place].  En accidentes laborales, cuando se derraman grandes cantidades de sustancias qumicas txicas en fbricas o industrias. Tambin es posible encontrar sustancias qumicas que podran ser perjudiciales en muchos limpiadores y otros productos domsticos comunes; por ejemplo:  Aerosoles.  Limpiadores de hornos.  Desodorantes de [place].  Lustres para muebles. Mezclar amonaco (o un producto que contenga amonaco) con leja (o un producto que contenga leja) es otra causa frecuente de las lesiones por inhalacin de sustancias qumicas domsticas. Esta mezcla produce un gas nocivo (cloramina) que puede [employer]. Las lesiones por inhalacin de sustancias qumicas pueden variar desde una leve irritacin hasta una grave lesin en lo profundo de los pulmones. Esto podra afectar su capacidad para respirar. La gravedad de la lesin depender de la sustancia qumica involucrada, de cun nociva es y del [place] de exposicin a Acupuncturist. Cules son las causas? Las lesiones por inhalacin de sustancias qumicas son consecuencia de la inhalacin de gases o partculas de sustancias qumicas. Esto puede ocurrir [employer], en el hogar o durante un incendio. Qu incrementa el riesgo? Es ms probable que esta lesin se produzca en las personas que:  Esten expuestas a materiales que estn ardiendo.  Trabajan con sustancias qumicas, solventes o agentes de limpieza. Cules son los signos o  los sntomas? Los sntomas de esta lesin pueden incluir lo siguiente:  Ardor o [employer] ojos, la nariz, la garganta y la trquea.  Tos.  Falta de aire.  Sibilancias.  Problemas para respirar. Esto es consecuencia de la hinchazn de las vas respiratorias.  Tos con sangre.  Dolor de [place].  Mareos.  Nuseas o vmitos.  Debilidad o fatiga. Segn el tipo de la sustancia qumica a la que estuvo expuesto, podra tener sntomas de inmediato o hasta 12horas despus. Cmo se diagnostica? Esta lesin se diagnostica en funcin de los sntomas, los antecedentes mdicos y un examen fsico. Tambin pueden hacerle exmenes, por ejemplo:  Pruebas respiratorias para determinar la cantidad de aire que los pulmones pueden contener.  Pruebas para Doctor, general practice de oxgeno de la sangre (oximetra de pulso).  Estudios de diagnstico por imgenes, por ejemplo: ? Radiografas de trax. En estas se procura detectar la acumulacin de lquido o inflamacin en las vas respiratorias pequeas de los pulmones (bronquiolos). ? TC de trax. En este estudio se pueden detectar lesiones que en las radiografas no aparecen. Cmo se trata? El principal tratamiento para esta lesin es la oxigenoterapia. Tambin podran administrarle una mayor cantidad de oxgeno mediante una mascarilla o una cnula nasal. En el caso de las lesiones graves, se podra necesitar un equipo (respirador) para ayudarlo a Industrial/product designer. El tratamiento tambin puede incluir lo siguiente:  Medicamentos para abrir las vas respiratorias y disminuir la inflamacin (broncodilatadores o corticoesteroides).  Lquidos por va intravenosa.  Antibiticos para prevenir o tratar infecciones pulmonares causadas por bacterias. Segn la gravedad de la lesin, es posible que deba Personal assistant en el hospital para que lo controlen rigurosamente. Siga estas indicaciones en su casa:  [employer] medicamentos de venta libre y los recetados  solamente como se lo haya indicado el mdico.  Si le recetaron un antibitico, tmelo como se lo haya indicado el mdico. No deje de tomar los antibiticos aunque comience a Actor.  Retome sus actividades normales como se lo haya indicado el mdico. Pregntele al mdico qu actividades son seguras para usted.  No consuma ningn producto que contenga nicotina o tabaco, como cigarrillos y Administrator, Civil Service. Si necesita ayuda para dejar de fumar, consulte al mdico.  Concurra a todas las visitas de control como se lo haya indicado el mdico. Esto es importante. Cmo se evita? Para prevenir este tipo de lesiones:  No mezcle leja ni ningn producto que contenga leja con ningn limpiador que contenga amonaco.  No coloque sustancias qumicas potencialmente nocivas en atomizadores.  Lea todas las etiquetas y siga las indicaciones para usar los productos de limpieza.  nicamente utilice productos de limpieza en lugares con mucha ventilacin.  Si su puesto de trabajo implica un riesgo de inhalacin de sustancias qumicas, siga minuciosamente las medidas de seguridad. Es muy importante que limite su exposicin a gases y sustancias qumicas. Comunquese con un mdico si:  Tiene fiebre.  Los sntomas no mejoran o empeoran. Solicite ayuda de inmediato si:  Tiene dificultad para respirar.  Elimina sangre al toser. Resumen  Una lesin por inhalacin de sustancias qumicas ocurre cuando una persona respira (inhala) gases o partculas de sustancias qumicas que daan la garganta, las vas respiratorias o los pulmones (aparato respiratorio).  Las lesiones por inhalacin de sustancias qumicas ocurren con mayor frecuencia durante incendios o accidentes en el trabajo. Tambin es posible encontrar sustancias qumicas que podran ser perjudiciales en muchos limpiadores y otros productos domsticos comunes.  El principal tratamiento para esta lesin es la oxigenoterapia. Tambin  podran administrarle una mayor cantidad de oxgeno mediante una mascarilla o una cnula nasal.  Si su puesto de trabajo implica un riesgo de inhalacin de sustancias qumicas, siga minuciosamente las medidas de seguridad. Es muy importante que limite su exposicin a gases y sustancias qumicas. Esta informacin no tiene Theme park manager el consejo del mdico. Asegrese de hacerle al mdico cualquier pregunta que tenga. Document Revised: 06/20/2017 Document Reviewed: 06/20/2017 Elsevier Patient Education  2020 [employer].

## 2020-07-15 ENCOUNTER — Other Ambulatory Visit: Payer: Self-pay

## 2020-07-15 ENCOUNTER — Encounter (HOSPITAL_COMMUNITY): Payer: Self-pay | Admitting: Obstetrics and Gynecology

## 2020-07-15 ENCOUNTER — Inpatient Hospital Stay (HOSPITAL_BASED_OUTPATIENT_CLINIC_OR_DEPARTMENT_OTHER): Payer: Self-pay

## 2020-07-15 ENCOUNTER — Inpatient Hospital Stay (HOSPITAL_COMMUNITY)
Admission: AD | Admit: 2020-07-15 | Discharge: 2020-07-15 | Disposition: A | Discharge status: Home / Self Care | Payer: Self-pay | Attending: Obstetrics and Gynecology | Admitting: Obstetrics and Gynecology

## 2020-07-15 DIAGNOSIS — O4692 Antepartum hemorrhage, unspecified, second trimester: Secondary | ICD-10-CM

## 2020-07-15 DIAGNOSIS — O4412 Placenta previa with hemorrhage, second trimester: Secondary | ICD-10-CM | POA: Insufficient documentation

## 2020-07-15 DIAGNOSIS — Z3A14 14 weeks gestation of pregnancy: Secondary | ICD-10-CM

## 2020-07-15 DIAGNOSIS — Z8759 Personal history of other complications of pregnancy, childbirth and the puerperium: Secondary | ICD-10-CM | POA: Insufficient documentation

## 2020-07-15 DIAGNOSIS — O4402 Placenta previa specified as without hemorrhage, second trimester: Secondary | ICD-10-CM

## 2020-07-15 DIAGNOSIS — O09292 Supervision of pregnancy with other poor reproductive or obstetric history, second trimester: Secondary | ICD-10-CM | POA: Insufficient documentation

## 2020-07-15 LAB — URINALYSIS, ROUTINE W REFLEX MICROSCOPIC
Bilirubin Urine: NEGATIVE
Glucose, UA: NEGATIVE mg/dL
Ketones, ur: NEGATIVE mg/dL
Leukocytes,Ua: NEGATIVE
Nitrite: NEGATIVE
Protein, ur: NEGATIVE mg/dL
RBC / HPF: >50 RBC/hpf — ABNORMAL HIGH (ref 0–5)
Specific Gravity, Urine: 1.014 (ref 1.005–1.030)
pH: 6 (ref 5.0–8.0)

## 2020-07-15 NOTE — Discharge Instructions (Signed)
Restriccin de la Consulting civil engineer Activity Restriction During Pregnancy El mdico puede recomendarle restricciones de actividades especficas durante el embarazo por varios motivos. La restriccin de la actividad puede requerir que limite actividades que requieren un gran esfuerzo, como el ejercicio, levantar objetos o el sexo. El tipo de restriccin de la actividad ser diferente para cada persona, segn el riesgo o los problemas que tenga. Es posible que se le recomiende restringir la actividad durante un perodo de tiempo hasta que nazca el beb. Por qu se recomienda restringir la actividad? Se puede recomendar la restriccin de la actividad si:  La placenta cubre de forma parcial o total la abertura del cuello uterino (placenta previa).  Hay sangrado entre la pared del tero y el saco amnitico en el primer trimestre de Psychiatrist (hemorragia subcorinica).  El [place] de parto comienza muy pronto (trabajo de parto prematuro).  Usted tiene antecedentes de abortos espontneos.  Tiene una afeccin que provoca presin arterial alta durante el embarazo (preeclampsia o eclampsia).  Est embarazada de ms de un beb.  El beb no est creciendo bien. Cules son los riesgos? Los riesgos dependen de Soil scientist. El reposo absoluto en cama tiene la mayor cantidad de riesgos fsicos y emocionales y ya no se recomienda de forma rutinaria. Los riesgos del reposo absoluto en cama incluyen:  Prdida del acondicionamiento muscular debido a la falta de movimiento.  Cogulos de [place].  Aislamiento social.  Depresin.  Prdida de ingresos. Hable con su equipo de atencin [employer] restriccin de actividades para decidir si es lo mejor para usted y el beb. Incluso si tiene [employer], es posible que pueda continuar con sus niveles normales de [place] con el control cuidadoso de su equipo de atencin mdica. Siga estas indicaciones en su  casa: Si es necesario, segn su salud general y la salud del beb, el mdico decidir qu tipo de restriccin de actividades es adecuado para usted. Las restricciones de actividades pueden incluir lo siguiente:  No levantar ningn objeto que pese ms de 10 libras (4,5 kg).  Evitar las actividades que exijan mucho esfuerzo fsico.  No levantar objetos ni hacer esfuerzos.  Hacer reposo sentada o acostada durante algunos perodos de [place] a lo largo del Futures trader. Se podr recomendar reposo plvico junto con la restriccin de [employer] [facility]. Si se recomienda reposo plvico, entonces:  No tenga sexo, orgasmos ni use estimulacin sexual.  No use tampones. No se haga duchas vaginales. No se introduzca nada en la vagina.  No levante ningn objeto que pese ms de 10libras (4.5kg).  Evite actividades que requieran mucho esfuerzo.  Evite cualquier actividad que requiera esfuerzo de los msculos de la pelvis, como ponerse en cuclillas. Preguntas para hacerle al mdico  Por qu me limitan la actividad?  Cmo afectar a mi cuerpo la restriccin de la actividad?  Por qu el reposo es til para m y el beb?  Qu actividades puedo hacer?  Cundo puedo [employer] normales? Cundo debo buscar asistencia mdica inmediata? Solicite atencin mdica si tiene:  Sangrado vaginal.  Secrecin vaginal.  Clicos en la zona inferior del abdomen.  Contracciones regulares.  Un dolor sordo en la parte baja de la espalda. Resumen  El mdico puede recomendarle restricciones de actividades especficas durante el embarazo por varios motivos.  La restriccin de actividades puede requerir que limite actividades como el ejercicio, levantar objetos, el sexo o cualquier otra actividad que requiera un gran esfuerzo.  Hable con su equipo  de atencin [employer] y beneficios de la restriccin de actividades para decidir si es lo mejor para usted y el beb.  Comunquese de  inmediato con el mdico si piensa que tiene contracciones o si observa sangrado vaginal, secrecin o tiene clicos. Esta informacin no tiene Theme park manager el consejo del mdico. Asegrese de hacerle al mdico cualquier pregunta que tenga. Document Revised: 03/28/2018 Document Reviewed: 03/28/2018 Elsevier Patient Education  2020 Elsevier Inc. Hemorragia vaginal durante el segundo trimestre de Psychiatrist  Vaginal Bleeding During Pregnancy, Second Trimester  Durante el embarazo es relativamente comn que se presente un pequeo sangrado (prdidas) proveniente de la vagina. Normalmente esto se detiene por s solo. Hay diversos factores que pueden causar prdidas en Firefighter. A veces, la hemorragia es normal y no es signo de un problema en Firefighter. Sin embargo, la hemorragia tambin puede ser un signo de algo grave. Debe informar a su mdico de inmediato si tiene alguna hemorragia vaginal. Algunas causas posibles de hemorragia vaginal durante el segundo trimestre incluyen lo siguiente:  Infeccin, inflamacin o crecimientos anmalos (plipos) en el cuello uterino.  Una afeccin por la cual la placenta cubre parcial o completamente la abertura del cuello uterino (placenta previa).  La placenta se separa del tero (desprendimiento de placenta).  Trabajo de parto temprano (prematuro).  Se abre y se afina el cuello uterino antes que el [employer] llegue a trmino y antes de Games developer el trabajo de parto ( cuello uterino incompetente).  Una masa de tejido que crece en el tero debido a una mala fertilizacin del vulo (embarazo molar). Siga estas indicaciones en su casa: Actividad  Siga las indicaciones del mdico respecto de las restricciones en las actividades. Pregunte qu actividades son seguras para usted.  Si es necesario, organcese para que alguien la ayude con las actividades cotidianas.  No haga ejercicios ni actividades que requieran mucho esfuerzo hasta que su mdico lo  autorice.  No levante ningn objeto que pese ms de 10libras (4,5kg) o el lmite de peso que le indique su mdico hasta que l le diga que puede [place].  No tenga relaciones sexuales ni orgasmos hasta que su mdico le diga que es seguro. Medicamentos  [employer] de venta libre y los recetados solamente como se lo haya indicado el mdico.  No tome aspirina, ya que puede causar hemorragias. Instrucciones generales  Est atenta a cualquier cambio en los sntomas.  Lleve un registro de la cantidad de apsitos higinicos que [place] por da, la frecuencia con la que los cambia y cun empapados (saturados) estn.  No use tampones ni se haga duchas vaginales.  Si elimina tejido por la vagina, gurdelo para mostrrselo al [employer].  Concurra a todas las visitas de control como se lo haya indicado el mdico. Esto es importante. Comunquese con un mdico si:  Tiene un sangrado vaginal en cualquier momento del embarazo.  Tiene calambres o dolores de [employer].  Tiene fiebre que no baja cuando toma medicamentos. Solicite ayuda de inmediato si:  Tiene clicos intensos en la espalda o el abdomen.  Siente contracciones.  Tiene escalofros.  Elimina cogulos grandes o gran cantidad de tejido por la vagina.  La hemorragia aumenta.  Se siente mareada, dbil, o se desmaya.  Tiene una prdida importante o sale lquido a borbotones por la vagina. Resumen  Hay diversos factores que pueden causar hemorragia o prdidas en el embarazo.  Debe informar a su mdico de inmediato si tiene alguna hemorragia vaginal.  Siga  las indicaciones del mdico respecto de las restricciones en las actividades. Pregunte qu actividades son seguras para usted. Esta informacin no tiene Theme park manager el consejo del mdico. Asegrese de hacerle al mdico cualquier pregunta que tenga. Document Revised: 07/27/2017 Document Reviewed: 07/27/2017 Elsevier Patient Education  2020 [employer].

## 2020-07-15 NOTE — MAU Provider Note (Signed)
Chief Complaint: Vaginal Bleeding   First Provider Initiated Contact with Patient 07/15/20 1559     *Spanish interpreter at bedside for this encounter*  SUBJECTIVE HPI: Nichole Soto is a 29 y.o. G3P1001 at [redacted]w[redacted]d who presents to Maternity Admissions reporting vaginal bleeding.  Symptoms started about 15 minutes prior to arrival. Reports dark red bleeding like a period. Has seen it in her underwear but not saturating pads. No blood clots. Denies abdominal pain. No recent intercourse or exams.   Past Medical History:  Diagnosis Date  . 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 Preterm premature rupture of membranes 08/27/2019   Dx on 10/12 with mfm anatomy u/s  . 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 Pt never told she couldn't labor in the future.    Past Surgical History:  Procedure Laterality Date  . CESAREAN SECTION     Social History   Socioeconomic History  . Marital status: Significant Other    Spouse name: Not on file  . Number of children: Not on file  . Years of education: Not on file  . Highest education level: Not on file  Occupational History  . Not on file  Tobacco Use  . Smoking status: Never Smoker  . Smokeless tobacco: Never Used  Vaping Use  . Vaping Use: Never used  Substance and Sexual Activity  . Alcohol use: Not Currently  . Drug use: Never  . Sexual  activity: Yes    Birth control/protection: None  Other Topics Concern  . Not on file  Social History Narrative  . Not on file   Social Determinants of Health   Financial Resource Strain:   . Difficulty of Paying Living Expenses: Not on file  Food Insecurity: No Food Insecurity  . Worried About Programme researcher, broadcasting/film/video in the Last Year: Never true  . Ran Out of Food in the Last Year: Never true  Transportation Needs: Unmet Transportation Needs  . Lack of Transportation (Medical): Yes  . Lack of Transportation (Non-Medical): Yes  Physical Activity:   . Days of Exercise per Week: Not on file  . Minutes of Exercise per Session: Not on file  Stress:   . Feeling of Stress : Not on file  Social Connections:   . Frequency of Communication with Friends and Family: Not on file  . Frequency of Social Gatherings with Friends and Family: Not on file  . Attends Religious Services: Not on file  . Active Member of Clubs or Organizations: Not on file  . Attends Banker Meetings: Not on file  . Marital Status: Not on file  Intimate Partner Violence: Not At  Risk  . Fear of Current or Ex-Partner: No  . Emotionally Abused: No  . Physically Abused: No  . Sexually Abused: No   History reviewed. No pertinent family history. No current facility-administered medications on file prior to encounter.   Current Outpatient Medications on File Prior to Encounter  Medication Sig Dispense Refill  . Prenatal Vit-Fe Fumarate-FA (PRENATAL VITAMINS PO) Take 1 tablet by mouth daily.    . famotidine (PEPCID) 40 MG tablet Take 1 tablet (40 mg total) by mouth at bedtime. 30 tablet 11  . Prenatal Vit-Fe Fumarate-FA (PREPLUS) 27-1 MG TABS Take 1 tablet by mouth daily. (Patient not taking: Reported on 12/11/2019) 30 tablet 13   No Known Allergies  I have reviewed patient's Past Medical Hx, Surgical Hx, Family Hx, Social Hx, medications and allergies.   Review of Systems  Constitutional: Negative.    Gastrointestinal: Negative.   Genitourinary: Positive for vaginal bleeding.    OBJECTIVE Patient Vitals for the past 24 hrs:  BP Temp Pulse Resp SpO2 Weight  07/15/20 1444 107/63 98.5 F (36.9 C) 79 20 100 % --  07/15/20 1437 -- -- -- -- -- 66.1 kg   Constitutional: Well-developed, well-nourished female in no acute distress.  Cardiovascular: normal rate & rhythm, no murmur Respiratory: normal rate and effort. Lung sounds clear throughout GI: Abd soft, non-tender, Pos BS x 4. No guarding or rebound tenderness MS: Extremities nontender, no edema, normal ROM Neurologic: Alert and oriented x 4.  GU:     SPECULUM EXAM: NEFG, small amount of dark red blood coming from os. Cervix pink/smooth/not friable  BIMANUAL: No CMT. cervix closed/thick  LAB RESULTS Results for orders placed or performed during the hospital encounter of 07/15/20 (from the past 24 hour(s))  Urinalysis, Routine w reflex microscopic Urine, Clean Catch     Status: Abnormal   Collection Time: 07/15/20  2:54 PM  Result Value Ref Range   Color, Urine YELLOW YELLOW   APPearance CLEAR CLEAR   Specific Gravity, Urine 1.014 1.005 - 1.030   pH 6.0 5.0 - 8.0   Glucose, UA NEGATIVE NEGATIVE mg/dL   Hgb urine dipstick LARGE (A) NEGATIVE   Bilirubin Urine NEGATIVE NEGATIVE   Ketones, ur NEGATIVE NEGATIVE mg/dL   Protein, ur NEGATIVE NEGATIVE mg/dL   Nitrite NEGATIVE NEGATIVE   Leukocytes,Ua NEGATIVE NEGATIVE   RBC / HPF >50 (H) 0 - 5 RBC/hpf   WBC, UA 0-5 0 - 5 WBC/hpf   Bacteria, UA RARE (A) NONE SEEN   Squamous Epithelial / LPF 0-5 0 - 5   Mucus PRESENT     IMAGING [place] MFM OB LIMITED  Result Date: 07/15/2020 ----------------------------------------------------------------------  OBSTETRICS REPORT                       (Signed Final 07/15/2020 05:18 pm) ---------------------------------------------------------------------- Patient Info  ID #:       [id number]                          D.O.B.:  12/02/1990 (28 yrs)  Name:        Nichole Soto              Visit Date: 07/15/2020 04:38 pm              ANDRES ---------------------------------------------------------------------- Performed By  Attending:        Ma Rings MD         Ref. Address:  [address]  Performed By:     Marcellina Millin          Secondary Phy.:   [facility] MAU/Triage                    RDMS  Referred By:      Judeth Horn          [facility] ---------------------------------------------------------------------- Orders  #  Description                           Code        Ordered By  1  [place] MFM OB LIMITED                     06301.60    Judeth Horn ----------------------------------------------------------------------  #  Order #                     Accession #                Episode #  1  [id number]                   [id number]                 [id number] ---------------------------------------------------------------------- Indications  Vaginal bleeding in pregnancy, second          O46.92  trimester  [redacted] weeks gestation of pregnancy                Z3A.14 ---------------------------------------------------------------------- Fetal Evaluation  Num Of Fetuses:         1  Fetal Heart Rate(bpm):  137  Cardiac Activity:       Observed  Presentation:           Variable  Placenta:               Anterior  P. Cord Insertion:      Visualized  Amniotic Fluid  AFI FV:      Within normal limits ---------------------------------------------------------------------- OB History  Gravidity:    3         Term:   1        Prem:   1        SAB:   0  TOP:          0       Ectopic:  0        Living: 1 ---------------------------------------------------------------------- Gestational Age  LMP:           14w 1d        Date:  04/07/20  EDD:   01/12/21  Best:          14w 1d     Det. By:  LMP  (04/07/20)           EDD:   01/12/21 ---------------------------------------------------------------------- Anatomy  Cranium:               Appears normal         Bladder:                Appears normal  Choroid Plexus:        Appears normal         Upper Extremities:      Visualized  Stomach:               Appears normal, left   Lower Extremities:      Visualized                         sided  Abdomen:               Appears normal ---------------------------------------------------------------------- Cervix Uterus Adnexa  Cervix  Closed  Uterus  Bicornuate.  Right Ovary  Within normal limits.  Left Ovary  Within normal limits.  Cul De Sac  No free fluid seen.  Adnexa  No abnormality visualized. ---------------------------------------------------------------------- Comments  This patient presented to the MAU due to vaginal bleeding.  A limited ultrasound performed today shows a viable  singleton intrauterine pregnancy in the vertex presentation.  There was normal amniotic fluid noted.  Placenta previa is noted today.  There appears to be a  venous sinus with active blood flow noted directly above the  internal cervical os.  This is the most likely source of her  vaginal bleeding.  There is a high likelihood that the placenta  previa will resolve later in her pregnancy.  She should be  scheduled for a detailed fetal anatomy scan in the MFM office  at around 19 weeks. ----------------------------------------------------------------------                   Ma Rings, MD Electronically Signed Final Report   07/15/2020 05:18 pm ----------------------------------------------------------------------   MAU COURSE Orders Placed This Encounter  Procedures  . [place] MFM OB LIMITED  . Urinalysis, Routine w reflex microscopic Urine, Clean Catch  . Discharge patient   No orders of the defined types were placed in this encounter.   MDM FHT present via doppler RH positive  Small amount of bleeding on exam & cervix closed Limited  ultrasound shows a placenta previa with venous sinus over the cervix  Reviewed results with patient & SO using Spanish interpreter. Discussed pelvic rest. Will reassess placenta location at anatomy ultrasound.   ASSESSMENT 1. Placenta previa in second trimester   2. [redacted] weeks gestation of pregnancy   3. Vaginal bleeding in pregnancy, second trimester     PLAN Discharge home in stable condition. Bleeding precautions Pelvic rest   Allergies as of 07/15/2020   No Known Allergies     Medication List    STOP taking these medications   famotidine 40 MG tablet Commonly known as: PEPCID     TAKE these medications   PRENATAL VITAMINS PO Take 1 tablet by mouth daily. What changed: Another medication with the same name was removed. Continue taking this medication, and follow the directions you see here.        Judeth Horn, NP 07/15/2020  5:58 PM

## 2020-07-15 NOTE — MAU Note (Signed)
Presents with c/o VB that began today approximately 15 minutes ago.  Reports VB is like a menstrual cycle. Denies abdominal pain or cramping.

## 2020-07-16 ENCOUNTER — Encounter: Payer: Self-pay | Admitting: Obstetrics and Gynecology

## 2020-07-16 ENCOUNTER — Ambulatory Visit (INDEPENDENT_AMBULATORY_CARE_PROVIDER_SITE_OTHER): Payer: Self-pay | Admitting: Obstetrics and Gynecology

## 2020-07-16 ENCOUNTER — Other Ambulatory Visit: Payer: Self-pay

## 2020-07-16 VITALS — BP 111/71 | HR 85 | Wt 145.0 lb

## 2020-07-16 DIAGNOSIS — Z98891 History of uterine scar from previous surgery: Secondary | ICD-10-CM

## 2020-07-16 DIAGNOSIS — O3400 Maternal care for unspecified congenital malformation of uterus, unspecified trimester: Secondary | ICD-10-CM | POA: Insufficient documentation

## 2020-07-16 DIAGNOSIS — O09299 Supervision of pregnancy with other poor reproductive or obstetric history, unspecified trimester: Secondary | ICD-10-CM | POA: Insufficient documentation

## 2020-07-16 DIAGNOSIS — O3402 Maternal care for unspecified congenital malformation of uterus, second trimester: Secondary | ICD-10-CM

## 2020-07-16 DIAGNOSIS — O09292 Supervision of pregnancy with other poor reproductive or obstetric history, second trimester: Secondary | ICD-10-CM

## 2020-07-16 DIAGNOSIS — O0990 Supervision of high risk pregnancy, unspecified, unspecified trimester: Secondary | ICD-10-CM

## 2020-07-16 DIAGNOSIS — Q513 Bicornate uterus: Secondary | ICD-10-CM

## 2020-07-16 HISTORY — DX: Supervision of pregnancy with other poor reproductive or obstetric history, unspecified trimester: O09.299

## 2020-07-16 HISTORY — DX: Maternal care for unspecified congenital malformation of uterus, unspecified trimester: O34.00

## 2020-07-16 MED ORDER — ASPIRIN EC 81 MG PO TBEC: 81.0000 mg | DELAYED_RELEASE_TABLET | Freq: Every day | ORAL | 2 refills | Status: DC

## 2020-07-16 NOTE — Patient Instructions (Signed)
Segundo trimestre de embarazo Second Trimester of Pregnancy  El segundo trimestre va desde la semana14 hasta la 27 (desde el mes 4 hasta el 6). Este suele ser el momento en el que mejor se siente. En general, las nuseas matutinas han disminuido o han desaparecido completamente. Tendr ms energa y podr aumentarle el apetito. El beb en gestacin se desarrolla rpidamente. Hacia el final del sexto mes, el beb mide aproximadamente 9 pulgadas (23 cm) y pesa alrededor de 1 libras (700 g). Es probable que sienta al beb moverse entre las 18 y 20 semanas del embarazo. Siga estas indicaciones en su casa: Medicamentos  Tome los medicamentos de venta libre y los recetados solamente como se lo haya indicado el mdico. Algunos medicamentos son seguros para tomar durante el embarazo y otros no lo son.  Tome vitaminas prenatales que contengan por lo menos 600microgramos (?g) de cido flico.  Si tiene dificultad para mover el intestino (estreimiento), tome un medicamento para ablandar las heces (laxante) si su mdico se lo autoriza. Comida y bebida   Ingiera alimentos saludables de manera regular.  No coma carne cruda ni quesos sin cocinar.  Si obtiene poca cantidad de calcio de los alimentos que ingiere, consulte a su mdico sobre la posibilidad de tomar un suplemento diario de calcio.  Evite el consumo de alimentos ricos en grasas y azcares, como los alimentos fritos y los dulces.  Si tiene malestar estomacal (nuseas) o devuelve (vomita): ? Ingiera 4 o 5comidas pequeas por da en lugar de 3abundantes. ? Intente comer algunas galletitas saladas. ? Beba lquidos entre las comidas, en lugar de hacerlo durante estas.  Para evitar el estreimiento: ? Consuma alimentos ricos en fibra, como frutas y verduras frescas, cereales integrales y frijoles. ? Beba suficiente lquido para mantener el pis (orina) claro o de color amarillo plido. Actividad  Haga ejercicios solamente como se lo haya  indicado el mdico. Interrumpa la actividad fsica si comienza a tener calambres.  No haga ejercicio si hace demasiado calor, hay demasiada humedad o se encuentra en un lugar de mucha altura (altitud alta).  Evite levantar pesos excesivos.  Use zapatos con tacones bajos. Mantenga una buena postura al sentarse y pararse.  Puede continuar teniendo relaciones sexuales, a menos que el mdico le indique lo contrario. Alivio del dolor y del malestar  Use un sostn que le brinde buen soporte si sus mamas estn sensibles.  Dese baos de asiento con agua tibia para aliviar el dolor o las molestias causadas por las hemorroides. Use una crema para las hemorroides si el mdico la autoriza.  Descanse con las piernas elevadas si tiene calambres o dolor de cintura.  Si desarrolla venas hinchadas y abultadas (vrices) en las piernas: ? Use medias de compresin o medias de descanso como se lo haya indicado el mdico. ? Levante (eleve) los pies durante 15minutos, 3 o 4veces por da. ? Limite el consumo de sal en sus alimentos. Cuidado prenatal  Escriba sus preguntas. Llvelas cuando concurra a las visitas prenatales.  Concurra a todas las visitas prenatales como se lo haya indicado el mdico. Esto es importante. Seguridad  Colquese el cinturn de seguridad cuando conduzca.  Haga una lista de los nmeros de telfono de emergencia, que incluya los nmeros de telfono de familiares, amigos, el hospital, as como los departamentos de polica y bomberos. Instrucciones generales  Consulte a su mdico sobre los alimentos que debe comer o pdale que la ayude a encontrar a quien pueda aconsejarla si necesita ese servicio.    Consulte a su mdico acerca de dnde se dictan clases prenatales cerca de donde vive. Comience las clases antes del mes 6 de embarazo.  No se d baos de inmersin en agua caliente, baos turcos ni saunas.  No se haga duchas vaginales ni use tampones o toallas higinicas perfumadas.   No mantenga las piernas cruzadas durante mucho tiempo.  Vaya al dentista si an no lo hizo. Use un cepillo de cerdas suaves para cepillarse los dientes. Psese el hilo dental suavemente.  No fume, no consuma hierbas ni beba alcohol. No tome frmacos que el mdico no haya autorizado.  No consuma ningn producto que contenga nicotina o tabaco, como cigarrillos y cigarrillos electrnicos. Si necesita ayuda para dejar de fumar, consulte al mdico.  Evite el contacto con las bandejas sanitarias de los gatos y la tierra que estos animales usan. Estos elementos contienen bacterias que pueden causar defectos congnitos al beb y la posible prdida del beb (aborto espontneo) o la muerte fetal. Comunquese con un mdico si:  Tiene clicos leves o siente presin en la parte baja del vientre.  Tiene dolor al hacer pis (orinar).  Advierte un lquido con olor ftido que proviene de la vagina.  Tiene malestar estomacal (nuseas), devuelve (vomita) o tiene deposiciones acuosas (diarrea).  Sufre un dolor persistente en el abdomen.  Siente mareos. Solicite ayuda de inmediato si:  Tiene fiebre.  Tiene una prdida de lquido por la vagina.  Tiene sangrado o pequeas prdidas vaginales.  Siente dolor intenso o clicos en el abdomen.  Sube o baja de peso rpidamente.  Tiene dificultades para recuperar el aliento y siente dolor en el pecho.  Sbitamente se le hinchan mucho el rostro, las manos, los tobillos, los pies o las piernas.  No ha sentido los movimientos del beb durante una hora.  Siente un dolor de cabeza intenso que no se alivia al tomar medicamentos.  Tiene dificultad para ver. Resumen  El segundo trimestre va desde la semana14 hasta la 27, desde el mes 4 hasta el 6. Este suele ser el momento en el que mejor se siente.  Para cuidarse y cuidar a su beb en gestacin, debe comer alimentos saludables, tomar medicamentos solamente si su mdico le indica que lo haga y hacer  actividades que sean seguras para usted y su beb.  Llame al mdico si se enferma o si nota algo inusual acerca de su embarazo. Tambin llame al mdico si necesita ayuda para saber qu alimentos debe comer o si quiere saber qu actividades puede realizar de forma segura. Esta informacin no tiene como fin reemplazar el consejo del mdico. Asegrese de hacerle al mdico cualquier pregunta que tenga. Document Revised: 07/26/2017 Document Reviewed: 07/26/2017 Elsevier Patient Education  2020 Elsevier Inc.  

## 2020-07-16 NOTE — Progress Notes (Signed)
Subjective:  Nichole Soto is a 29 y.o. G3P1001 at [redacted]w[redacted]d being seen today for ongoing prenatal care.  She is currently monitored for the following issues for this high-risk pregnancy and has Language barrier; History of malpresentation; History of cesarean delivery; Contraception management; Supervision of high risk pregnancy, antepartum; Bicornate uterus complicating pregnancy; and History of premature rupture of membranes in previous pregnancy, currently pregnant on their problem list.  Patient reports was seen in MAU yesterday for VB, d/t small placenta previa. Only scant bleeding now. .  Contractions: Not present. Vag. Bleeding: Scant.   . Denies leaking of fluid.   The following portions of the patient's history were reviewed and updated as appropriate: allergies, current medications, past family history, past medical history, past social history, past surgical history and problem list. Problem list updated.  Objective:   Vitals:   07/16/20 1449  BP: 111/71  Pulse: 85  Weight: 145 lb (65.8 kg)    Fetal Status: Fetal Heart Rate (bpm): 147         General:  Alert, oriented and cooperative. Patient is in no acute distress.  Skin: Skin is warm and dry. No rash noted.   Cardiovascular: Normal heart rate noted  Respiratory: Normal respiratory effort, no problems with respiration noted  Abdomen: Soft, gravid, appropriate for gestational age. Pain/Pressure: Absent     Pelvic:  Cervical exam deferred        Extremities: Normal range of motion.  Edema: None  Mental Status: Normal mood and affect. Normal behavior. Normal judgment and thought content.   Urinalysis:      Assessment and Plan:  Pregnancy: G3P1001 at [redacted]w[redacted]d  1. Supervision of high risk pregnancy, antepartum Stable AFP next visit  2. History of cesarean delivery Will need to discuss at later appts  3. 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 Take after 12 weeks for prevention of preeclampsia later in pregnancy  Dispense: 300 tablet; Refill: 2  4. History of premature rupture of membranes in previous pregnancy, currently pregnant in second trimester  - [place] MFM OB DETAIL +14 WK; Future - aspirin EC 81 MG tablet; Take 1 tablet (81 mg total) by mouth daily. Take after 12 weeks for prevention of preeclampsia later in pregnancy  Dispense: 300 tablet; Refill: 2  Preterm labor symptoms and general obstetric precautions including but not limited to vaginal bleeding, contractions, leaking of fluid and fetal movement were reviewed in detail with the patient. Please refer to After Visit Summary for other counseling recommendations.  Return in about 4 weeks (around 08/13/2020) for OB visit, face to face, MD only.   Hermina Staggers, MD

## 2020-08-13 ENCOUNTER — Ambulatory Visit (INDEPENDENT_AMBULATORY_CARE_PROVIDER_SITE_OTHER): Payer: Self-pay | Admitting: Obstetrics and Gynecology

## 2020-08-13 ENCOUNTER — Encounter: Payer: Self-pay | Admitting: Obstetrics and Gynecology

## 2020-08-13 ENCOUNTER — Other Ambulatory Visit: Payer: Self-pay

## 2020-08-13 VITALS — BP 113/72 | HR 80 | Wt 148.3 lb

## 2020-08-13 DIAGNOSIS — O0990 Supervision of high risk pregnancy, unspecified, unspecified trimester: Secondary | ICD-10-CM

## 2020-08-13 DIAGNOSIS — Q513 Bicornate uterus: Secondary | ICD-10-CM

## 2020-08-13 DIAGNOSIS — O09292 Supervision of pregnancy with other poor reproductive or obstetric history, second trimester: Secondary | ICD-10-CM

## 2020-08-13 DIAGNOSIS — Z98891 History of uterine scar from previous surgery: Secondary | ICD-10-CM

## 2020-08-13 DIAGNOSIS — O3402 Maternal care for unspecified congenital malformation of uterus, second trimester: Secondary | ICD-10-CM

## 2020-08-13 NOTE — Patient Instructions (Signed)

## 2020-08-13 NOTE — Progress Notes (Signed)
Subjective:  Nichole Soto is a 29 y.o. G3P1001 at [redacted]w[redacted]d being seen today for ongoing prenatal care.  She is currently monitored for the following issues for this high-risk pregnancy and has Language barrier; History of malpresentation; History of cesarean delivery; Contraception management; Supervision of high risk pregnancy, antepartum; Bicornate uterus complicating pregnancy; and History of premature rupture of membranes in previous pregnancy, currently pregnant on their problem list.  Patient reports no complaints.  Contractions: Not present. Vag. Bleeding: None.  Movement: Present. Denies leaking of fluid.   The following portions of the patient's history were reviewed and updated as appropriate: allergies, current medications, past family history, past medical history, past social history, past surgical history and problem list. Problem list updated.  Objective:   Vitals:   08/13/20 1419  BP: 113/72  Pulse: 80  Weight: 148 lb 4.8 oz (67.3 kg)    Fetal Status: Fetal Heart Rate (bpm): 154   Movement: Present     General:  Alert, oriented and cooperative. Patient is in no acute distress.  Skin: Skin is warm and dry. No rash noted.   Cardiovascular: Normal heart rate noted  Respiratory: Normal respiratory effort, no problems with respiration noted  Abdomen: Soft, gravid, appropriate for gestational age. Pain/Pressure: Absent     Pelvic:  Cervical exam deferred        Extremities: Normal range of motion.  Edema: None  Mental Status: Normal mood and affect. Normal behavior. Normal judgment and thought content.   Urinalysis:      Assessment and Plan:  Pregnancy: G3P1001 at [redacted]w[redacted]d  1. Supervision of high risk pregnancy, antepartum Stable Declined Flu and Covid vaccine - AFP, Serum, Open Spina Bifida  2. 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 History of cesarean delivery Discussed delivery mode at later appts  4.  History of premature rupture of membranes in previous pregnancy, currently pregnant in second trimester   Preterm labor symptoms and general obstetric precautions including but not limited to vaginal bleeding, contractions, leaking of fluid and fetal movement were reviewed in detail with the patient. Please refer to After Visit Summary for other counseling recommendations.  Return in about 4 weeks (around 09/10/2020) for OB visit, face to face, MD only.   Hermina Staggers, MD

## 2020-08-15 LAB — AFP, SERUM, OPEN SPINA BIFIDA
AFP MoM: 1
AFP Value: 46.2 ng/mL
Gest. Age on Collection Date: 18.3 weeks
Maternal Age At EDD: 29.4 yr
OSBR Risk 1 IN: 10000
Test Results:: NEGATIVE
Weight: 148 [lb_av]

## 2020-08-17 ENCOUNTER — Telehealth: Payer: Self-pay | Admitting: *Deleted

## 2020-08-17 NOTE — Telephone Encounter (Addendum)
-----   Message from Hermina Staggers, MD sent at 08/17/2020  9:06 AM EDT ----- Please let Ms Nichole Soto that her AFP was normal. Thanks Casimiro Needle   10/4  1445  Called pt w/[employer] interpreter [phone].  I informed her of normal AFP results. Pt voiced understanding and had no questions.

## 2020-08-25 ENCOUNTER — Ambulatory Visit: Payer: Self-pay | Attending: Obstetrics and Gynecology

## 2020-08-25 ENCOUNTER — Other Ambulatory Visit: Payer: Self-pay | Admitting: *Deleted

## 2020-08-25 ENCOUNTER — Ambulatory Visit: Payer: Self-pay | Admitting: *Deleted

## 2020-08-25 ENCOUNTER — Other Ambulatory Visit: Payer: Self-pay

## 2020-08-25 ENCOUNTER — Encounter: Payer: Self-pay | Admitting: *Deleted

## 2020-08-25 DIAGNOSIS — Q513 Bicornate uterus: Secondary | ICD-10-CM | POA: Insufficient documentation

## 2020-08-25 DIAGNOSIS — O09292 Supervision of pregnancy with other poor reproductive or obstetric history, second trimester: Secondary | ICD-10-CM | POA: Insufficient documentation

## 2020-08-25 DIAGNOSIS — O3402 Maternal care for unspecified congenital malformation of uterus, second trimester: Secondary | ICD-10-CM | POA: Insufficient documentation

## 2020-08-25 DIAGNOSIS — O0990 Supervision of high risk pregnancy, unspecified, unspecified trimester: Secondary | ICD-10-CM | POA: Insufficient documentation

## 2020-08-25 DIAGNOSIS — O09219 Supervision of pregnancy with history of pre-term labor, unspecified trimester: Secondary | ICD-10-CM

## 2020-08-29 ENCOUNTER — Telehealth: Payer: Self-pay | Admitting: *Deleted

## 2020-08-29 ENCOUNTER — Emergency Department (HOSPITAL_COMMUNITY): Payer: No Typology Code available for payment source

## 2020-08-29 ENCOUNTER — Emergency Department (HOSPITAL_COMMUNITY)
Admission: EM | Admit: 2020-08-29 | Discharge: 2020-08-29 | Disposition: A | Discharge status: Home / Self Care | Payer: No Typology Code available for payment source | Attending: Emergency Medicine | Admitting: Emergency Medicine

## 2020-08-29 ENCOUNTER — Other Ambulatory Visit: Payer: Self-pay

## 2020-08-29 ENCOUNTER — Encounter (HOSPITAL_COMMUNITY): Payer: Self-pay | Admitting: Emergency Medicine

## 2020-08-29 DIAGNOSIS — M545 Low back pain, unspecified: Secondary | ICD-10-CM | POA: Diagnosis not present

## 2020-08-29 DIAGNOSIS — R1030 Lower abdominal pain, unspecified: Secondary | ICD-10-CM

## 2020-08-29 DIAGNOSIS — Z20822 Contact with and (suspected) exposure to covid-19: Secondary | ICD-10-CM | POA: Insufficient documentation

## 2020-08-29 DIAGNOSIS — O26892 Other specified pregnancy related conditions, second trimester: Secondary | ICD-10-CM | POA: Insufficient documentation

## 2020-08-29 DIAGNOSIS — O99891 Other specified diseases and conditions complicating pregnancy: Secondary | ICD-10-CM | POA: Insufficient documentation

## 2020-08-29 DIAGNOSIS — Z3A21 21 weeks gestation of pregnancy: Secondary | ICD-10-CM | POA: Diagnosis not present

## 2020-08-29 LAB — CBC
HCT: 34.1 % — ABNORMAL LOW (ref 36.0–46.0)
Hemoglobin: 11 g/dL — ABNORMAL LOW (ref 12.0–15.0)
MCH: 28.9 pg (ref 26.0–34.0)
MCHC: 32.3 g/dL (ref 30.0–36.0)
MCV: 89.5 fL (ref 80.0–100.0)
Platelets: 278 10*3/uL (ref 150–400)
RBC: 3.81 MIL/uL — ABNORMAL LOW (ref 3.87–5.11)
RDW: 14.5 % (ref 11.5–15.5)
WBC: 9.4 10*3/uL (ref 4.0–10.5)
nRBC: 0 % (ref 0.0–0.2)

## 2020-08-29 LAB — COMPREHENSIVE METABOLIC PANEL
ALT: 20 U/L (ref 0–44)
AST: 16 U/L (ref 15–41)
Albumin: 3.1 g/dL — ABNORMAL LOW (ref 3.5–5.0)
Alkaline Phosphatase: 78 U/L (ref 38–126)
Anion gap: 12 (ref 5–15)
BUN: <5 mg/dL — ABNORMAL LOW (ref 6–20)
CO2: 18 mmol/L — ABNORMAL LOW (ref 22–32)
Calcium: 9 mg/dL (ref 8.9–10.3)
Chloride: 105 mmol/L (ref 98–111)
Creatinine, Ser: 0.37 mg/dL — ABNORMAL LOW (ref 0.44–1.00)
GFR, Estimated: >60 mL/min (ref >60)
Glucose, Bld: 78 mg/dL (ref 70–99)
Potassium: 3.7 mmol/L (ref 3.5–5.1)
Sodium: 135 mmol/L (ref 135–145)
Total Bilirubin: 0.6 mg/dL (ref 0.3–1.2)
Total Protein: 6.5 g/dL (ref 6.5–8.1)

## 2020-08-29 LAB — URINALYSIS, ROUTINE W REFLEX MICROSCOPIC
Bilirubin Urine: NEGATIVE
Glucose, UA: NEGATIVE mg/dL
Hgb urine dipstick: NEGATIVE
Ketones, ur: NEGATIVE mg/dL
Leukocytes,Ua: NEGATIVE
Nitrite: NEGATIVE
Protein, ur: NEGATIVE mg/dL
Specific Gravity, Urine: 1.004 — ABNORMAL LOW (ref 1.005–1.030)
pH: 7 (ref 5.0–8.0)

## 2020-08-29 LAB — I-STAT CHEM 8, ED
BUN: <3 mg/dL — ABNORMAL LOW (ref 6–20)
Calcium, Ion: 1.2 mmol/L (ref 1.15–1.40)
Chloride: 105 mmol/L (ref 98–111)
Creatinine, Ser: 0.3 mg/dL — ABNORMAL LOW (ref 0.44–1.00)
Glucose, Bld: 78 mg/dL (ref 70–99)
HCT: 31 % — ABNORMAL LOW (ref 36.0–46.0)
Hemoglobin: 10.5 g/dL — ABNORMAL LOW (ref 12.0–15.0)
Potassium: 3.7 mmol/L (ref 3.5–5.1)
Sodium: 137 mmol/L (ref 135–145)
TCO2: 22 mmol/L (ref 22–32)

## 2020-08-29 LAB — SAMPLE TO BLOOD BANK

## 2020-08-29 LAB — RESPIRATORY PANEL BY RT PCR (FLU A&B, COVID)
Influenza A by PCR: NEGATIVE
Influenza B by PCR: NEGATIVE
SARS Coronavirus 2 by RT PCR: NEGATIVE

## 2020-08-29 LAB — LACTIC ACID, PLASMA: Lactic Acid, Venous: 1.1 mmol/L (ref 0.5–1.9)

## 2020-08-29 LAB — PROTIME-INR
INR: 1 (ref 0.8–1.2)
Prothrombin Time: 12.7 seconds (ref 11.4–15.2)

## 2020-08-29 LAB — ETHANOL: Alcohol, Ethyl (B): <10 mg/dL (ref <10)

## 2020-08-29 MED ORDER — IOHEXOL 300 MG/ML  SOLN
100.0000 mL | Freq: Once | INTRAMUSCULAR | Status: AC | PRN
  Administered 2020-08-29: 100 mL via INTRAVENOUS

## 2020-08-29 MED ORDER — ONDANSETRON HCL 4 MG/2ML IJ SOLN: 4.0000 mg | Freq: Once | INTRAMUSCULAR | Status: AC

## 2020-08-29 MED ORDER — SODIUM CHLORIDE 0.9 % IV BOLUS
1000.0000 mL | Freq: Once | INTRAVENOUS | Status: AC
  Administered 2020-08-29: 1000 mL via INTRAVENOUS

## 2020-08-29 MED ORDER — MORPHINE SULFATE (PF) 4 MG/ML IV SOLN
4.0000 mg | Freq: Once | INTRAVENOUS | Status: AC
  Administered 2020-08-29: 4 mg via INTRAVENOUS
  Filled 2020-08-29: qty 1

## 2020-08-29 MED ORDER — ONDANSETRON HCL 4 MG/2ML IJ SOLN
INTRAMUSCULAR | Status: AC
  Administered 2020-08-29: 4 mg via INTRAVENOUS
  Filled 2020-08-29: qty 2
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
## 2020-08-29 NOTE — Progress Notes (Signed)
Spoke with Dr. Shawnie Pons. Pt is obstetrically cleared since there is no vaginal bleeding or leaking of fluid, her abd is soft, and she is not at the age of viability. ED staff notified.
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
## 2020-09-10 ENCOUNTER — Ambulatory Visit (INDEPENDENT_AMBULATORY_CARE_PROVIDER_SITE_OTHER): Payer: Self-pay | Admitting: Obstetrics and Gynecology

## 2020-09-10 ENCOUNTER — Other Ambulatory Visit: Payer: Self-pay

## 2020-09-10 VITALS — BP 113/65 | HR 74 | Wt 155.0 lb

## 2020-09-10 DIAGNOSIS — Z789 Other specified health status: Secondary | ICD-10-CM

## 2020-09-10 DIAGNOSIS — R519 Headache, unspecified: Secondary | ICD-10-CM | POA: Insufficient documentation

## 2020-09-10 DIAGNOSIS — H43399 Other vitreous opacities, unspecified eye: Secondary | ICD-10-CM | POA: Insufficient documentation

## 2020-09-10 DIAGNOSIS — Z98891 History of uterine scar from previous surgery: Secondary | ICD-10-CM

## 2020-09-10 DIAGNOSIS — K8020 Calculus of gallbladder without cholecystitis without obstruction: Secondary | ICD-10-CM

## 2020-09-10 DIAGNOSIS — M79604 Pain in right leg: Secondary | ICD-10-CM | POA: Insufficient documentation

## 2020-09-10 DIAGNOSIS — O0990 Supervision of high risk pregnancy, unspecified, unspecified trimester: Secondary | ICD-10-CM

## 2020-09-10 DIAGNOSIS — Z3A22 22 weeks gestation of pregnancy: Secondary | ICD-10-CM
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
## 2020-09-11 ENCOUNTER — Telehealth: Payer: Self-pay | Admitting: Lactation Services

## 2020-09-11 NOTE — Telephone Encounter (Signed)
Tresa Endo from [facility] called and LM that a referral to Orthopedics has been placed for patient.

## 2020-09-22 ENCOUNTER — Other Ambulatory Visit: Payer: Self-pay | Admitting: *Deleted

## 2020-09-22 ENCOUNTER — Ambulatory Visit: Payer: Self-pay | Admitting: *Deleted

## 2020-09-22 ENCOUNTER — Encounter: Payer: Self-pay | Admitting: *Deleted

## 2020-09-22 ENCOUNTER — Ambulatory Visit: Payer: Self-pay | Attending: Obstetrics and Gynecology

## 2020-09-22 ENCOUNTER — Other Ambulatory Visit: Payer: Self-pay

## 2020-09-22 DIAGNOSIS — O0990 Supervision of high risk pregnancy, unspecified, unspecified trimester: Secondary | ICD-10-CM

## 2020-09-22 DIAGNOSIS — O34592 Maternal care for other abnormalities of gravid uterus, second trimester: Secondary | ICD-10-CM

## 2020-09-22 DIAGNOSIS — O321XX0 Maternal care for breech presentation, not applicable or unspecified: Secondary | ICD-10-CM

## 2020-09-22 DIAGNOSIS — Z3A24 24 weeks gestation of pregnancy: Secondary | ICD-10-CM

## 2020-09-22 DIAGNOSIS — O09219 Supervision of pregnancy with history of pre-term labor, unspecified trimester: Secondary | ICD-10-CM | POA: Insufficient documentation

## 2020-09-22 DIAGNOSIS — O09212 Supervision of pregnancy with history of pre-term labor, second trimester: Secondary | ICD-10-CM

## 2020-09-22 DIAGNOSIS — Q513 Bicornate uterus: Secondary | ICD-10-CM

## 2020-09-22 DIAGNOSIS — Z8759 Personal history of other complications of pregnancy, childbirth and the puerperium: Secondary | ICD-10-CM

## 2020-09-22 DIAGNOSIS — O34219 Maternal care for unspecified type scar from previous cesarean delivery: Secondary | ICD-10-CM

## 2020-09-23 ENCOUNTER — Ambulatory Visit (INDEPENDENT_AMBULATORY_CARE_PROVIDER_SITE_OTHER): Payer: Self-pay | Admitting: Obstetrics & Gynecology

## 2020-09-23 VITALS — BP 113/60 | HR 80 | Wt 155.3 lb

## 2020-09-23 DIAGNOSIS — Z98891 History of uterine scar from previous surgery: Secondary | ICD-10-CM

## 2020-09-23 DIAGNOSIS — O0990 Supervision of high risk pregnancy, unspecified, unspecified trimester: Secondary | ICD-10-CM

## 2020-09-23 DIAGNOSIS — Z3A24 24 weeks gestation of pregnancy: Secondary | ICD-10-CM

## 2020-09-23 NOTE — Progress Notes (Signed)
PRENATAL VISIT NOTE  Subjective:  Nichole Soto is a 29 y.o. G3P1001 at [redacted]w[redacted]d being seen today for ongoing prenatal care. Patient is Spanish-speaking only, interpreter present for this encounter. She is currently monitored for the following issues for this high-risk pregnancy and has Language barrier; History of malpresentation; History of cesarean delivery; Contraception management; Supervision of high risk pregnancy, antepartum; Bicornate uterus complicating pregnancy; History of premature rupture of membranes in previous pregnancy, currently pregnant; Gallstones; Right leg pain; Nonintractable headache; and Spots in front of the eye on their problem list.  Patient reports no complaints.  Contractions: Not present. Vag. Bleeding: None.  Movement: Present. Denies leaking of fluid.   The following portions of the patient's history were reviewed and updated as appropriate: allergies, current medications, past family history, past medical history, past social history, past surgical history and problem list.   Objective:   Vitals:   09/23/20 1415  BP: 113/60  Pulse: 80  Weight: 155 lb 4.8 oz (70.4 kg)    Fetal Status: Fetal Heart Rate (bpm): 135   Movement: Present     General:  Alert, oriented and cooperative. Patient is in no acute distress.  Skin: Skin is warm and dry. No rash noted.   Cardiovascular: Normal heart rate noted  Respiratory: Normal respiratory effort, no problems with respiration noted  Abdomen: Soft, gravid, appropriate for gestational age.  Pain/Pressure: Absent     Pelvic: Cervical exam deferred        Extremities: Normal range of motion.  Edema: None  Mental Status: Normal mood and affect. Normal behavior. Normal judgment and thought content.   Imaging: [place] MFM OB FOLLOW UP  Result Date: 09/22/2020 ----------------------------------------------------------------------  OBSTETRICS REPORT                       (Signed Final 09/22/2020 11:11 am)  ---------------------------------------------------------------------- Patient Info  ID #:       [id number]                          D.O.B.:  03/31/1991 (29 yrs)  Name:       Nichole Soto              Visit Date: 09/22/2020 10:39 am              ANDRES ---------------------------------------------------------------------- Performed By  Attending:        Noralee Space MD        Ref. Address:     [address]                                                             [place], [place]                                                             [address]  Performed By:     Emeline Darling BS,      Secondary Phy.:   [facility] MAU/Triage  RDMS  Referred By:      [facility]          [facility] ---------------------------------------------------------------------- Orders  #  Description                           Code        Ordered By  1  [place] MFM OB FOLLOW UP                   [id number]    Rosana Hoes ----------------------------------------------------------------------  #  Order #                     Accession #                Episode #  1  [id number]                   [id number]                 [id number] ---------------------------------------------------------------------- Indications  Uterine abnormality during                     O34.599  pregnancy(Bicornuate)  Poor obstetric history: Previous preterm       O09.219  delivery, antepartum (Twins, following  PROM)  History of cesarean delivery, currently        O34.219  pregnant  Negative AFP  [redacted] weeks gestation of pregnancy                Z3A.24 ---------------------------------------------------------------------- Fetal Evaluation  Num Of Fetuses:         1  Fetal Heart Rate(bpm):  135  Cardiac Activity:       Observed   Presentation:           Homero Fellers breech  Placenta:               Posterior  P. Cord Insertion:      Previously Visualized  Amniotic Fluid  AFI FV:      Within normal limits  AFI Sum(cm)     %Tile       Largest Pocket(cm)  17.85           70          5.51  RUQ(cm)       RLQ(cm)  LUQ(cm)        LLQ(cm)  3.08          4.72          5.51           4.54 ---------------------------------------------------------------------- Biometry  BPD:      61.7  mm     G. Age:  25w 0d         80  %    CI:        75.31   %    70 - 86                                                          FL/HC:      18.8   %    18.7 - 20.9  HC:      225.5  mm     G. Age:  24w 4d         54  %    HC/AC:      1.00        1.05 - 1.21  AC:      226.4  mm     G. Age:  27w 0d         99  %    FL/BPD:     68.6   %    71 - 87  FL:       42.3  mm     G. Age:  23w 6d         31  %    FL/AC:      18.7   %    20 - 24  Est. FW:     823  gm    1 lb 13 oz      96  % ---------------------------------------------------------------------- OB History  Gravidity:    3         Term:   1        Prem:   1        SAB:   0  TOP:          0       Ectopic:  0        Living: 1 ---------------------------------------------------------------------- Gestational Age  LMP:           24w 0d        Date:  04/07/20                 EDD:   01/12/21  U/S Today:     25w 1d                                        EDD:   01/04/21  Best:          24w 0d     Det. By:  LMP  (04/07/20)          EDD:   01/12/21 ---------------------------------------------------------------------- Anatomy  Cranium:               Appears normal         Aortic Arch:            Previously seen  Cavum:  Previously seen        Ductal Arch:            Previously seen  Ventricles:            Appears normal         Diaphragm:              Appears normal  Choroid Plexus:        Previously seen        Stomach:                Appears normal, left                                                                         sided  Cerebellum:            Appears normal         Abdomen:                Appears normal  Posterior Fossa:       Appears normal         Abdominal Wall:         Previously seen  Nuchal Fold:           Appears normal         Cord Vessels:           Previously seen  Face:                  Orbits and profile     Kidneys:                Appear normal                         previously seen  Lips:                  Previously seen        Bladder:                Appears normal  Thoracic:              Appears normal         Spine:                  Previously seen  Heart:                 Appears normal         Upper Extremities:      Previously seen                         (4CH, axis, and                         situs)  RVOT:                  Previously seen        Lower Extremities:      Previously seen  LVOT:                  Previously seen  Other:  Fetus appears to be a female.  Nasal bone, Right Open hand, Heels          and 5th digit previously visualized. Technically difficult due to          maternal habitus and fetal position. ---------------------------------------------------------------------- Cervix Uterus Adnexa  Cervix  Length:            3.3  cm.  Normal appearance by transabdominal scan. ---------------------------------------------------------------------- Impression  Patient returned for fetal growth assessment.  Obstetrical  history is significant for a preterm delivery at [redacted] weeks  gestation following PPROM at 19 weeks.  That pregnancy  was complicated by vanishing twin diagnosed at [redacted] weeks  gestation.  Rupture of membranes, transvaginal cervical  length measurement was 3.6 cm.  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  Obstetric history is also significant for a term cesarean  delivery in [place] in 2012 of a female infant.  She has a  vertical skin scar the uterine incision is undocumented.  On today's ultrasound, amniotic fluid is normal good fetal  activity seen.  The  estimated fetal weight is at the 96  percentile.  On transabdominal scan, the cervix looks long  and closed.  I explained the findings with help of language interpreter  present in the room.  Because of undocumented scar and the  nonavailability of previous operative note, it is reasonable to  perform cesarean section at [redacted] weeks gestation. ---------------------------------------------------------------------- Recommendations  -Appointment was made for her to return in 8 weeks for fetal  growth assessment.  -Consider repeat cesarean delivery at [redacted] weeks gestation  -Screening for gestational diabetes at her next prenatal visit.  -Recommend postnatal (3 months after delivery) evaluation  of uterine cavity by sonohysterography to confirm bicornuate  uterus ----------------------------------------------------------------------                  Noralee Space, MD Electronically Signed Final Report   09/22/2020 11:11 am ----------------------------------------------------------------------   Assessment and Plan:  Pregnancy: G3P1001 at [redacted]w[redacted]d 1. History of cesarean delivery History of malpresentation due to bicornuate uterus.  Will evaluate presentation and discuss TOLAC if possible later in pregnancy, she is interested in this.   2. [redacted] weeks gestation of pregnancy 3. Supervision of high risk pregnancy, antepartum Normal anatomy scan.  DM screening next visit. Preterm labor symptoms and general obstetric precautions including but not limited to vaginal bleeding, contractions, leaking of fluid and fetal movement were reviewed in detail with the patient. Please refer to After Visit Summary for other counseling recommendations.   Return in about 4 weeks (around 10/21/2020) for 2 hr GTT, 3rd trimester labs, TDap, OFFICE OB VISIT (MD only).  Future Appointments  Date Time Provider Department Center  11/17/2020  3:30 PM [facility] NURSE [facility]  11/17/2020  3:45 PM [facility]4 [facility]    Jaynie Collins,  MD

## 2020-09-23 NOTE — Patient Instructions (Signed)
Parto vaginal despus de un parto por cesrea Vaginal Birth After Cesarean Delivery  Un parto vaginal despus de un parto por cesrea (PVDC) es dar a luz por la vagina despus de haber dado a luz por medio de una intervencin quirrgica llamada cesrea. Es posible que el PVDC sea una alternativa segura para usted, segn su salud y otros factores. Es importante que hable con su mdico acerca del PVDC desde comienzos del embarazo de modo que pueda comprender los riesgos, beneficios y opciones. Hablar sobre estos temas desde el comienzo le permitir tener tiempo para planificar el parto. Quines son las mejores candidatas para tener un PVDC? Las mejores candidatas para tener un PVDC son las mujeres que cumplen con los siguientes requisitos:  Tuvieron uno o dos partos por cesrea anteriores, y las incisiones realizadas durante los partos fueron horizontales (transversales bajas).  No tienen una cicatriz uterina vertical (clsica).  No han tenido una ruptura en la pared del tero (ruptura uterina).  Piensan tener otros embarazos. Es ms probable que un PVDC se realice correctamente en los siguientes casos:  Mujeres que ya tuvieron partos vaginales antes.  Cuando el trabajo de parto comienza sin ninguna intervencin (de forma espontnea) antes de la fecha prevista. Cules son los beneficios del PVDC? Algunos de los beneficios de que el beb nazca por parto vaginal en lugar de por cesrea:  Estada ms corta en el hospital.  Menor tiempo de recuperacin.  Menos dolor.  Evitar los riesgos asociados a las cirugas mayores, como las infecciones y los cogulos de sangre.  Menor prdida de sangre y menor probabilidad de necesitar sangre de un donante (transfusin). Cules son los riesgos del PVDC? El principal riesgo de intentar tener un PVDC es que existe una posibilidad de que falle, lo que obligar al mdico a realizar una cesrea. Los otros riesgos son muy poco frecuentes y pueden ser  los siguientes:  Desgarro (ruptura) de la cicatriz de un parto por cesrea anterior.  Otros riesgos asociados con los partos vaginales. Si se debe repetir un parto por cesrea, los riesgos incluyen los siguientes:  Prdida de sangre.  Infeccin.  Cogulos de sangre.  Lesin en los rganos circundantes.  Extirpacin del tero (histerectoma), si se daa.  Problemas de placenta en embarazos futuros. Qu ms debo saber sobre las alternativas? El parto vaginal despus de una cesrea es similar a un parto espontneo vaginal normal. Por lo tanto, es seguro:  Intentarlo cuando se trata de gemelos.  Que el mdico intente girar al beb si se encuentra de nalgas (versin ceflica externa) durante el trabajo de parto.  Colocar anestesia epidural para aliviar el dolor. Considerar posibilidades sobre dnde le gustara que ocurriera el parto. El PVDC debe llevarse a cabo en centros donde se pueda realizar un parto por cesrea de urgencia. No se recomiendan los partos domiciliarios si planea tener un PVDC. Cualquier cambio en su salud o la de su beb durante el embarazo puede ser motivo de un cambio de decisin respecto del parto vaginal. El mdico podra recomendarle no realizar un PVDC en los siguientes casos:  El beb parece pesar 8.8lb (4kg) o ms.  Tiene preeclampsia. Esta es una afeccin que provoca el aumento de la presin arterial y otros sntomas, como hinchazn y dolores de cabeza.  Tendr un PVDC menos de 19meses despus de un parto por cesrea.  Ya pas la fecha prevista del parto.  Necesita que se inicie el trabajo de parto (induccin) porque el cuello uterino no est listo para   el parto (desfavorable). Dnde encontrar ms informacin  Asociacin Americana del Embarazo (American Pregnancy Association): americanpregnancy.org.  Congreso Estadounidense de Obstetras y Gineclogos (American College of Obstetricians and Gynecologists): acog.org Resumen  Un parto vaginal  despus de un parto por cesrea (PVDC) es dar a luz por la vagina despus de haber dado a luz por medio de una intervencin quirrgica llamada cesrea. Es posible que el PVDC sea una alternativa segura para usted, segn su salud y otros factores.  Hable con el mdico desde comienzos del embarazo para que pueda comprender los riesgos, los beneficios y las alternativas, y tener mucho tiempo para planificar el parto.  El principal riesgo de intentar tener un PVDC es que existe una posibilidad de que falle, lo que obligar al mdico a realizar una cesrea. Los otros riesgos son muy poco frecuentes. Esta informacin no tiene como fin reemplazar el consejo del mdico. Asegrese de hacerle al mdico cualquier pregunta que tenga. Document Revised: 06/06/2017 Document Reviewed: 06/06/2017 Elsevier Patient Education  2020 Elsevier Inc.  

## 2020-10-21 ENCOUNTER — Other Ambulatory Visit: Payer: Self-pay

## 2020-10-21 ENCOUNTER — Ambulatory Visit (INDEPENDENT_AMBULATORY_CARE_PROVIDER_SITE_OTHER): Payer: Self-pay | Admitting: Obstetrics & Gynecology

## 2020-10-21 ENCOUNTER — Encounter: Payer: Self-pay | Admitting: Obstetrics & Gynecology

## 2020-10-21 VITALS — BP 106/69 | HR 86 | Wt 158.7 lb

## 2020-10-21 DIAGNOSIS — O3403 Maternal care for unspecified congenital malformation of uterus, third trimester: Secondary | ICD-10-CM

## 2020-10-21 DIAGNOSIS — O0990 Supervision of high risk pregnancy, unspecified, unspecified trimester: Secondary | ICD-10-CM

## 2020-10-21 DIAGNOSIS — Z23 Encounter for immunization: Secondary | ICD-10-CM

## 2020-10-21 DIAGNOSIS — Z789 Other specified health status: Secondary | ICD-10-CM

## 2020-10-21 DIAGNOSIS — O09293 Supervision of pregnancy with other poor reproductive or obstetric history, third trimester: Secondary | ICD-10-CM

## 2020-10-21 DIAGNOSIS — Q513 Bicornate uterus: Secondary | ICD-10-CM

## 2020-10-21 NOTE — Patient Instructions (Signed)
Parto vaginal despus de un parto por cesrea Vaginal Birth After Cesarean Delivery  Un parto vaginal despus de un parto por cesrea (PVDC) es dar a luz por la vagina despus de haber dado a luz por medio de una intervencin quirrgica llamada cesrea. Es posible que el PVDC sea una alternativa segura para usted, segn su salud y otros factores. Es importante que hable con su mdico acerca del PVDC desde comienzos del embarazo de modo que pueda comprender los riesgos, beneficios y opciones. Hablar sobre estos temas desde el comienzo le permitir tener tiempo para planificar el parto. Quines son las mejores candidatas para tener un PVDC? Las mejores candidatas para tener un PVDC son las mujeres que cumplen con los siguientes requisitos:  Tuvieron uno o dos partos por cesrea anteriores, y las incisiones realizadas durante los partos fueron horizontales (transversales bajas).  No tienen una cicatriz uterina vertical (clsica).  No han tenido una ruptura en la pared del tero (ruptura uterina).  Piensan tener otros embarazos. Es ms probable que un PVDC se realice correctamente en los siguientes casos:  Mujeres que ya tuvieron partos vaginales antes.  Cuando el trabajo de parto comienza sin ninguna intervencin (de forma espontnea) antes de la fecha prevista. Cules son los beneficios del PVDC? Algunos de los beneficios de que el beb nazca por parto vaginal en lugar de por cesrea:  Estada ms corta en el hospital.  Menor tiempo de recuperacin.  Menos dolor.  Evitar los riesgos asociados a las cirugas mayores, como las infecciones y los cogulos de sangre.  Menor prdida de sangre y menor probabilidad de necesitar sangre de un donante (transfusin). Cules son los riesgos del PVDC? El principal riesgo de intentar tener un PVDC es que existe una posibilidad de que falle, lo que obligar al mdico a realizar una cesrea. Los otros riesgos son muy poco frecuentes y pueden ser  los siguientes:  Desgarro (ruptura) de la cicatriz de un parto por cesrea anterior.  Otros riesgos asociados con los partos vaginales. Si se debe repetir un parto por cesrea, los riesgos incluyen los siguientes:  Prdida de sangre.  Infeccin.  Cogulos de sangre.  Lesin en los rganos circundantes.  Extirpacin del tero (histerectoma), si se daa.  Problemas de placenta en embarazos futuros. Qu ms debo saber sobre las alternativas? El parto vaginal despus de una cesrea es similar a un parto espontneo vaginal normal. Por lo tanto, es seguro:  Intentarlo cuando se trata de gemelos.  Que el mdico intente girar al beb si se encuentra de nalgas (versin ceflica externa) durante el trabajo de parto.  Colocar anestesia epidural para aliviar el dolor. Considerar posibilidades sobre dnde le gustara que ocurriera el parto. El PVDC debe llevarse a cabo en centros donde se pueda realizar un parto por cesrea de urgencia. No se recomiendan los partos domiciliarios si planea tener un PVDC. Cualquier cambio en su salud o la de su beb durante el embarazo puede ser motivo de un cambio de decisin respecto del parto vaginal. El mdico podra recomendarle no realizar un PVDC en los siguientes casos:  El beb parece pesar 8.8lb (4kg) o ms.  Tiene preeclampsia. Esta es una afeccin que provoca el aumento de la presin arterial y otros sntomas, como hinchazn y dolores de cabeza.  Tendr un PVDC menos de 19meses despus de un parto por cesrea.  Ya pas la fecha prevista del parto.  Necesita que se inicie el trabajo de parto (induccin) porque el cuello uterino no est listo para   el parto (desfavorable). Dnde encontrar ms informacin  Asociacin Americana del Embarazo (American Pregnancy Association): americanpregnancy.org.  Congreso Estadounidense de Obstetras y Gineclogos (American College of Obstetricians and Gynecologists): acog.org Resumen  Un parto vaginal  despus de un parto por cesrea (PVDC) es dar a luz por la vagina despus de haber dado a luz por medio de una intervencin quirrgica llamada cesrea. Es posible que el PVDC sea una alternativa segura para usted, segn su salud y otros factores.  Hable con el mdico desde comienzos del embarazo para que pueda comprender los riesgos, los beneficios y las alternativas, y tener mucho tiempo para planificar el parto.  El principal riesgo de intentar tener un PVDC es que existe una posibilidad de que falle, lo que obligar al mdico a realizar una cesrea. Los otros riesgos son muy poco frecuentes. Esta informacin no tiene como fin reemplazar el consejo del mdico. Asegrese de hacerle al mdico cualquier pregunta que tenga. Document Revised: 06/06/2017 Document Reviewed: 06/06/2017 Elsevier Patient Education  2020 Elsevier Inc.  

## 2020-10-21 NOTE — Progress Notes (Signed)
   PRENATAL VISIT NOTE  Subjective:  Nichole Soto is a 29 y.o. G3P1001 at [redacted]w[redacted]d being seen today for ongoing prenatal care.  She is currently monitored for the following issues for this high-risk pregnancy and has Language barrier; History of malpresentation; History of cesarean delivery; Contraception management; Supervision of high risk pregnancy, antepartum; Bicornate uterus complicating pregnancy; History of premature rupture of membranes in previous pregnancy, currently pregnant; Gallstones; Right leg pain; Nonintractable headache; and Spots in front of the eye on their problem list.  Patient reports no complaints.  Contractions: Not present. Vag. Bleeding: None.  Movement: Present. Denies leaking of fluid.   The following portions of the patient's history were reviewed and updated as appropriate: allergies, current medications, past family history, past medical history, past social history, past surgical history and problem list.   Objective:   Vitals:   10/21/20 0846  BP: 106/69  Pulse: 86  Weight: 158 lb 11.2 oz (72 kg)    Fetal Status: Fetal Heart Rate (bpm): 138   Movement: Present     General:  Alert, oriented and cooperative. Patient is in no acute distress.  Skin: Skin is warm and dry. No rash noted.   Cardiovascular: Normal heart rate noted  Respiratory: Normal respiratory effort, no problems with respiration noted  Abdomen: Soft, gravid, appropriate for gestational age.  Pain/Pressure: Absent     Pelvic: Cervical exam deferred        Extremities: Normal range of motion.  Edema: None  Mental Status: Normal mood and affect. Normal behavior. Normal judgment and thought content.   Assessment and Plan:  Pregnancy: G3P1001 at [redacted]w[redacted]d 1. Supervision of high risk pregnancy, antepartum  - Tdap vaccine greater than or equal to 7yo IM  2. History of premature rupture of membranes in previous pregnancy, currently pregnant in third trimester Previous CS for  transverse lie  3. 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 Language barrier interpreter  Preterm labor symptoms and general obstetric precautions including but not limited to vaginal bleeding, contractions, leaking of fluid and fetal movement were reviewed in detail with the patient. Please refer to After Visit Summary for other counseling recommendations.   Return in about 2 weeks (around 11/04/2020).  Future Appointments  Date Time Provider Department Center  11/17/2020  3:30 PM [facility] NURSE [facility]  11/17/2020  3:45 PM [facility]4 [facility]    Scheryl Darter, MD

## 2020-10-22 LAB — CBC
Hematocrit: 31.9 % — ABNORMAL LOW (ref 34.0–46.6)
Hemoglobin: 10.9 g/dL — ABNORMAL LOW (ref 11.1–15.9)
MCH: 28.8 pg (ref 26.6–33.0)
MCHC: 34.2 g/dL (ref 31.5–35.7)
MCV: 84 fL (ref 79–97)
Platelets: 269 10*3/uL (ref 150–450)
RBC: 3.78 x10E6/uL (ref 3.77–5.28)
RDW: 13 % (ref 11.7–15.4)
WBC: 7.4 10*3/uL (ref 3.4–10.8)

## 2020-10-22 LAB — GLUCOSE TOLERANCE, 2 HOURS W/ 1HR
Glucose, 1 hour: 149 mg/dL (ref 65–179)
Glucose, 2 hour: 95 mg/dL (ref 65–152)
Glucose, Fasting: 63 mg/dL — ABNORMAL LOW (ref 65–91)

## 2020-10-22 LAB — RPR: RPR Ser Ql: NONREACTIVE

## 2020-10-22 LAB — HIV ANTIBODY (ROUTINE TESTING W REFLEX): HIV Screen 4th Generation wRfx: NONREACTIVE

## 2020-10-29 ENCOUNTER — Encounter: Payer: Self-pay | Admitting: *Deleted

## 2020-11-04 ENCOUNTER — Other Ambulatory Visit: Payer: Self-pay

## 2020-11-04 ENCOUNTER — Ambulatory Visit (INDEPENDENT_AMBULATORY_CARE_PROVIDER_SITE_OTHER): Payer: Self-pay | Admitting: Family Medicine

## 2020-11-04 VITALS — BP 115/72 | HR 101 | Wt 160.0 lb

## 2020-11-04 DIAGNOSIS — O0990 Supervision of high risk pregnancy, unspecified, unspecified trimester: Secondary | ICD-10-CM

## 2020-11-04 DIAGNOSIS — Z789 Other specified health status: Secondary | ICD-10-CM

## 2020-11-04 DIAGNOSIS — Z98891 History of uterine scar from previous surgery: Secondary | ICD-10-CM

## 2020-11-04 DIAGNOSIS — O3660X0 Maternal care for excessive fetal growth, unspecified trimester, not applicable or unspecified: Secondary | ICD-10-CM | POA: Insufficient documentation

## 2020-11-04 DIAGNOSIS — O09293 Supervision of pregnancy with other poor reproductive or obstetric history, third trimester: Secondary | ICD-10-CM

## 2020-11-04 DIAGNOSIS — Q513 Bicornate uterus: Secondary | ICD-10-CM

## 2020-11-04 DIAGNOSIS — O3663X0 Maternal care for excessive fetal growth, third trimester, not applicable or unspecified: Secondary | ICD-10-CM

## 2020-11-04 DIAGNOSIS — O3403 Maternal care for unspecified congenital malformation of uterus, third trimester: Secondary | ICD-10-CM

## 2020-11-04 NOTE — Progress Notes (Signed)
   PRENATAL VISIT NOTE  Subjective:  Nichole Soto is a 29 y.o. G3P1001 at [redacted]w[redacted]d being seen today for ongoing prenatal care.  She is currently monitored for the following issues for this high-risk pregnancy and has Language barrier; History of malpresentation; History of cesarean delivery; Supervision of high risk pregnancy, antepartum; Bicornate uterus complicating pregnancy; History of premature rupture of membranes in previous pregnancy, currently pregnant; Gallstones; Right leg pain; Nonintractable headache; Spots in front of the eye; and LGA (large for gestational age) fetus affecting management of mother on their problem list.  Patient reports no complaints.  Contractions: Irregular. Vag. Bleeding: None.  Movement: Present. Denies leaking of fluid.   The following portions of the patient's history were reviewed and updated as appropriate: allergies, current medications, past family history, past medical history, past social history, past surgical history and problem list.   Objective:   Vitals:   11/04/20 1353  BP: 115/72  Pulse: (!) 101  Weight: 160 lb (72.6 kg)    Fetal Status: Fetal Heart Rate (bpm): 131   Movement: Present     General:  Alert, oriented and cooperative. Patient is in no acute distress.  Skin: Skin is warm and dry. No rash noted.   Cardiovascular: Normal heart rate noted  Respiratory: Normal respiratory effort, no problems with respiration noted  Abdomen: Soft, gravid, appropriate for gestational age.  Pain/Pressure: Present     Pelvic: Cervical exam deferred        Extremities: Normal range of motion.  Edema: None  Mental Status: Normal mood and affect. Normal behavior. Normal judgment and thought content.   Assessment and Plan:  Pregnancy: G3P1001 at [redacted]w[redacted]d 1. Supervision of high risk pregnancy, antepartum -taking PNV and bASA -counseled on contraception today, still undecided  2. 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  History of cesarean delivery -History of c/s in [place] in 2012, vertical skin, never told she couldn't labor again, term twins, malpresentation -Desires TOLAC, signed 10/21/20  4. Excessive fetal growth affecting management of pregnancy in third trimester, single or unspecified fetus LGA-95%ile, discussed shoulder dystocia precautions  5. History of premature rupture of membranes in previous pregnancy, currently pregnant in third trimester   6. Language barrier In person spanish interpreter utilized for entirety of visit.  Preterm labor symptoms and general obstetric precautions including but not limited to vaginal bleeding, contractions, leaking of fluid and fetal movement were reviewed in detail with the patient. Please refer to After Visit Summary for other counseling recommendations.   No follow-ups on file.  Future Appointments  Date Time Provider Department Center  11/17/2020  3:30 PM [facility] NURSE [facility]  11/17/2020  3:45 PM [facility]4 [facility]    Alric Seton, MD

## 2020-11-14 HISTORY — PX: GALLBLADDER SURGERY: SHX652

## 2020-11-17 ENCOUNTER — Ambulatory Visit: Payer: Self-pay

## 2020-11-18 ENCOUNTER — Encounter: Payer: Self-pay | Admitting: Obstetrics and Gynecology

## 2020-11-19 ENCOUNTER — Ambulatory Visit (INDEPENDENT_AMBULATORY_CARE_PROVIDER_SITE_OTHER): Payer: Self-pay | Admitting: Obstetrics & Gynecology

## 2020-11-19 ENCOUNTER — Other Ambulatory Visit: Payer: Self-pay

## 2020-11-19 VITALS — BP 115/71 | HR 86 | Wt 161.0 lb

## 2020-11-19 DIAGNOSIS — O0990 Supervision of high risk pregnancy, unspecified, unspecified trimester: Secondary | ICD-10-CM

## 2020-11-19 DIAGNOSIS — Z98891 History of uterine scar from previous surgery: Secondary | ICD-10-CM

## 2020-11-19 NOTE — Progress Notes (Signed)
   PRENATAL VISIT NOTE  Subjective:  Nichole Soto is a 30 y.o. G3P1001 at [redacted]w[redacted]d being seen today for ongoing prenatal care.  She is currently monitored for the following issues for this high-risk pregnancy and has Language barrier; History of malpresentation; History of cesarean delivery; Supervision of high risk pregnancy, antepartum; Bicornate uterus complicating pregnancy; History of premature rupture of membranes in previous pregnancy, currently pregnant; Gallstones; Right leg pain; Nonintractable headache; Spots in front of the eye; and LGA (large for gestational age) fetus affecting management of mother on their problem list.  Patient reports occasional contractions.  Contractions: Irregular. Vag. Bleeding: None.  Movement: Present. Denies leaking of fluid.   The following portions of the patient's history were reviewed and updated as appropriate: allergies, current medications, past family history, past medical history, past social history, past surgical history and problem list.   Objective:   Vitals:   11/19/20 1344  BP: 115/71  Pulse: 86  Weight: 161 lb (73 kg)    Fetal Status: Fetal Heart Rate (bpm): 132 Fundal Height: 32 cm Movement: Present     General:  Alert, oriented and cooperative. Patient is in no acute distress.  Skin: Skin is warm and dry. No rash noted.   Cardiovascular: Normal heart rate noted  Respiratory: Normal respiratory effort, no problems with respiration noted  Abdomen: Soft, gravid, appropriate for gestational age.  Pain/Pressure: Absent     Pelvic: Cervical exam deferred        Extremities: Normal range of motion.  Edema: None  Mental Status: Normal mood and affect. Normal behavior. Normal judgment and thought content.   Assessment and Plan:  Pregnancy: G3P1001 at [redacted]w[redacted]d 1. History of cesarean delivery Signed TOLAC form   2. Supervision of high risk pregnancy, antepartum   Preterm labor symptoms and general obstetric precautions  including but not limited to vaginal bleeding, contractions, leaking of fluid and fetal movement were reviewed in detail with the patient. Please refer to After Visit Summary for other counseling recommendations.   Return in about 2 weeks (around 12/03/2020).  Future Appointments  Date Time Provider Department Center  11/30/2020  7:15 AM [facility]SE [facility]  11/30/2020  7:30 AM [facility] US3 [facility]    Scheryl Darter, MD

## 2020-11-19 NOTE — Patient Instructions (Signed)
Tercer trimestre de embarazo Third Trimester of Pregnancy  El tercer trimestre comprende desde la semana28 hasta la semana40 (desde el mes7 hasta el mes9). En este trimestre, el beb en gestacin (feto) crece muy rpidamente. Hacia el final del noveno mes, el beb en gestacin mide alrededor de 20pulgadas (45cm) de largo. Pesa entre 6y 10libras (2,70y 4,50kg). Siga estas indicaciones en su casa: Medicamentos  Tome los medicamentos de venta libre y los recetados solamente como se lo haya indicado el mdico. Algunos medicamentos son seguros para tomar durante el embarazo y otros no lo son.  Tome vitaminas prenatales que contengan por lo menos 600microgramos (?g) de cido flico.  Si tiene dificultad para mover el intestino (estreimiento), tome un medicamento para ablandar las heces (laxante) si su mdico se lo autoriza. Comida y bebida   Ingiera alimentos saludables de manera regular.  No coma carne cruda ni quesos sin cocinar.  Si obtiene poca cantidad de calcio de los alimentos que ingiere, consulte a su mdico sobre la posibilidad de tomar un suplemento diario de calcio.  La ingesta diaria de cuatro o cinco comidas pequeas en lugar de tres comidas abundantes.  Evite el consumo de alimentos ricos en grasas y azcares, como los alimentos fritos y los dulces.  Para evitar el estreimiento: ? Consuma alimentos ricos en fibra, como frutas y verduras frescas, cereales integrales y frijoles. ? Beba suficiente lquido para mantener el pis (orina) claro o de color amarillo plido. Actividad  Haga ejercicios solamente como se lo haya indicado el mdico. Interrumpa la actividad fsica si comienza a tener calambres.  No levante objetos pesados, use zapatos de tacones bajos y sintese derecha.  No haga ejercicio si hace demasiado calor, hay demasiada humedad o se encuentra en un lugar de mucha altura (altitud alta).  Puede continuar teniendo relaciones sexuales, a menos que el  mdico le indique lo contrario. Alivio del dolor y del malestar  Use un sostn que le brinde buen soporte si sus mamas estn sensibles.  Haga pausas frecuentes y descanse con las piernas levantadas si tiene calambres en las piernas o dolor en la zona lumbar.  Dese baos de asiento con agua tibia para aliviar el dolor o las molestias causadas por las hemorroides. Use una crema para las hemorroides si el mdico la autoriza.  Si desarrolla venas hinchadas y abultadas (vrices) en las piernas: ? Use medias de compresin o medias de descanso como se lo haya indicado el mdico. ? Levante (eleve) los pies durante 15minutos, 3 o 4veces por da. ? Limite el consumo de sal en sus alimentos. Seguridad  Colquese el cinturn de seguridad cuando conduzca.  Haga una lista de los nmeros de telfono de emergencia, que incluya los nmeros de telfono de familiares, amigos, el hospital, as como los departamentos de polica y bomberos. Preparacin para la llegada del beb Para prepararse para la llegada de su beb:  Tome clases prenatales.  Practique ir manejando al hospital.  Visite el hospital y recorra el rea de maternidad.  Hable en su trabajo acerca de tomar licencia cuando llegue el beb.  Prepare el bolso que llevar al hospital.  Prepare la habitacin del beb.  Concurra a los controles mdicos.  Compre un asiento de seguridad orientado hacia atrs para llevar al beb en el automvil. Aprenda cmo instalarlo en el auto. Instrucciones generales  No se d baos de inmersin en agua caliente, baos turcos ni saunas.  No consuma ningn producto que contenga nicotina o tabaco, como cigarrillos y cigarrillos   electrnicos. Si necesita ayuda para dejar de fumar, consulte al mdico.  No beba alcohol.  No se haga duchas vaginales ni use tampones o toallas higinicas perfumadas.  No mantenga las piernas cruzadas durante mucho tiempo.  No haga viajes de larga distancia, excepto si es  obligatorio. Hgalos solamente si su mdico la autoriza.  Visite a su dentista si no lo ha hecho durante el embarazo. Use un cepillo de cerdas suaves para cepillarse los dientes. Psese el hilo dental con suavidad.  Evite el contacto con las bandejas sanitarias de los gatos y la tierra que estos animales usan. Estos elementos contienen bacterias que pueden causar defectos congnitos al beb y la posible prdida del beb (aborto espontneo) o la muerte fetal.  Concurra a todas las visitas prenatales como se lo haya indicado el mdico. Esto es importante. Comunquese con un mdico si:  No est segura de si est en trabajo de parto o si ha roto la bolsa de las aguas.  Tiene mareos.  Tiene clicos leves o siente presin en la parte baja del vientre.  Sufre un dolor persistente en el abdomen.  Sigue teniendo malestar estomacal, vomita o tiene heces lquidas.  Advierte un lquido con olor ftido que proviene de la vagina.  Siente dolor al orinar. Solicite ayuda de inmediato si:  Tiene fiebre.  Tiene una prdida de lquido por la vagina.  Tiene sangrado o pequeas prdidas vaginales.  Siente dolor intenso o clicos en el abdomen.  Aumenta o baja de peso rpidamente.  Tiene dificultades para recuperar el aliento y siente dolor en el pecho.  Sbitamente se le hinchan mucho el rostro, las manos, los tobillos, los pies o las piernas.  No ha sentido los movimientos del beb durante una hora.  Siente un dolor de cabeza intenso que no se alivia con medicamentos.  Tiene dificultad para ver.  Tiene prdida de lquido o le sale un chorro de lquido de la vagina antes de estar en la semana 37.  Tiene espasmos abdominales (contracciones) regulares antes de estar en la semana 37. Resumen  El tercer trimestre comprende desde la semana28 hasta la semana40 (desde el mes7 hasta el mes9). Esta es la poca en que el beb en gestacin crece muy rpidamente.  Siga los consejos del mdico  con respecto a los medicamentos, la alimentacin y la actividad.  Preprese para la llegada del beb tomando las clases prenatales, preparando todo lo que necesitar el beb, arreglando la habitacin del beb y concurriendo a los controles mdicos.  Solicite ayuda de inmediato si tiene sangrado por la vagina, siente dolor en el pecho o tiene dificultad para respirar, o si no ha sentido que su beb se mueve en el transcurso de ms de una hora. Esta informacin no tiene como fin reemplazar el consejo del mdico. Asegrese de hacerle al mdico cualquier pregunta que tenga. Document Revised: 06/05/2017 Document Reviewed: 06/05/2017 Elsevier Patient Education  2020 Elsevier Inc.  

## 2020-11-30 ENCOUNTER — Ambulatory Visit: Payer: Self-pay

## 2020-12-03 ENCOUNTER — Ambulatory Visit (INDEPENDENT_AMBULATORY_CARE_PROVIDER_SITE_OTHER): Payer: Self-pay | Admitting: Obstetrics & Gynecology

## 2020-12-03 ENCOUNTER — Other Ambulatory Visit: Payer: Self-pay

## 2020-12-03 VITALS — BP 110/70 | HR 75 | Wt 163.2 lb

## 2020-12-03 DIAGNOSIS — Z789 Other specified health status: Secondary | ICD-10-CM

## 2020-12-03 DIAGNOSIS — O0990 Supervision of high risk pregnancy, unspecified, unspecified trimester: Secondary | ICD-10-CM

## 2020-12-03 DIAGNOSIS — Z3A34 34 weeks gestation of pregnancy: Secondary | ICD-10-CM

## 2020-12-03 DIAGNOSIS — O0993 Supervision of high risk pregnancy, unspecified, third trimester: Secondary | ICD-10-CM

## 2020-12-03 DIAGNOSIS — Z98891 History of uterine scar from previous surgery: Secondary | ICD-10-CM

## 2020-12-03 DIAGNOSIS — O3663X0 Maternal care for excessive fetal growth, third trimester, not applicable or unspecified: Secondary | ICD-10-CM

## 2020-12-03 NOTE — Patient Instructions (Signed)
Parto vaginal despus de un parto por cesrea Vaginal Birth After Cesarean Delivery  Un parto vaginal despus de un parto por cesrea (PVDC) es dar a luz por la vagina despus de haber dado a luz por medio de una intervencin quirrgica llamada cesrea. Es posible que el PVDC sea una alternativa segura para usted, segn su salud y otros factores. Es importante que hable con su mdico acerca del PVDC desde comienzos del embarazo de modo que pueda comprender los riesgos, beneficios y opciones. Hablar sobre estos temas desde el comienzo le permitir tener tiempo para planificar el parto. Quines son las mejores candidatas para tener un PVDC? Las mejores candidatas para tener un PVDC son las mujeres que cumplen con los siguientes requisitos:  Tuvieron uno o dos partos por cesrea anteriores, y las incisiones realizadas durante los partos fueron horizontales (transversales bajas).  No tienen una cicatriz uterina vertical (clsica).  No han tenido una ruptura en la pared del tero (ruptura uterina).  Piensan tener otros embarazos. Es ms probable que un PVDC se realice correctamente en los siguientes casos:  Mujeres que ya tuvieron partos vaginales antes.  Cuando el trabajo de parto comienza sin ninguna intervencin (de forma espontnea) antes de la fecha prevista. Cules son los beneficios del PVDC? Algunos de los beneficios de que el beb nazca por parto vaginal en lugar de por cesrea:  Estada ms corta en el hospital.  Menor tiempo de recuperacin.  Menos dolor.  Evitar los riesgos asociados a las cirugas mayores, como las infecciones y los cogulos de sangre.  Menor prdida de sangre y menor probabilidad de necesitar sangre de un donante (transfusin). Cules son los riesgos del PVDC? El principal riesgo de intentar tener un PVDC es que existe una posibilidad de que falle, lo que obligar al mdico a realizar una cesrea. Los otros riesgos son muy poco frecuentes y pueden ser  los siguientes:  Desgarro (ruptura) de la cicatriz de un parto por cesrea anterior.  Otros riesgos asociados con los partos vaginales. Si se debe repetir un parto por cesrea, los riesgos incluyen los siguientes:  Prdida de sangre.  Infeccin.  Cogulos de sangre.  Lesin en los rganos circundantes.  Extirpacin del tero (histerectoma), si se daa.  Problemas de placenta en embarazos futuros. Qu ms debo saber sobre las alternativas? El parto vaginal despus de una cesrea es similar a un parto espontneo vaginal normal. Por lo tanto, es seguro:  Intentarlo cuando se trata de gemelos.  Que el mdico intente girar al beb si se encuentra de nalgas (versin ceflica externa) durante el trabajo de parto.  Colocar anestesia epidural para aliviar el dolor. Considerar posibilidades sobre dnde le gustara que ocurriera el parto. El PVDC debe llevarse a cabo en centros donde se pueda realizar un parto por cesrea de urgencia. No se recomiendan los partos domiciliarios si planea tener un PVDC. Cualquier cambio en su salud o la de su beb durante el embarazo puede ser motivo de un cambio de decisin respecto del parto vaginal. El mdico podra recomendarle no realizar un PVDC en los siguientes casos:  El beb parece pesar 8.8lb (4kg) o ms.  Tiene preeclampsia. Esta es una afeccin que provoca el aumento de la presin arterial y otros sntomas, como hinchazn y dolores de cabeza.  Tendr un PVDC menos de 19meses despus de un parto por cesrea.  Ya pas la fecha prevista del parto.  Necesita que se inicie el trabajo de parto (induccin) porque el cuello uterino no est listo para   el parto (desfavorable). Dnde encontrar ms informacin  Asociacin Americana del Embarazo (American Pregnancy Association): americanpregnancy.org.  Congreso Estadounidense de Obstetras y Gineclogos (American College of Obstetricians and Gynecologists): acog.org Resumen  Un parto vaginal  despus de un parto por cesrea (PVDC) es dar a luz por la vagina despus de haber dado a luz por medio de una intervencin quirrgica llamada cesrea. Es posible que el PVDC sea una alternativa segura para usted, segn su salud y otros factores.  Hable con el mdico desde comienzos del embarazo para que pueda comprender los riesgos, los beneficios y las alternativas, y tener mucho tiempo para planificar el parto.  El principal riesgo de intentar tener un PVDC es que existe una posibilidad de que falle, lo que obligar al mdico a realizar una cesrea. Los otros riesgos son muy poco frecuentes. Esta informacin no tiene como fin reemplazar el consejo del mdico. Asegrese de hacerle al mdico cualquier pregunta que tenga. Document Revised: 06/06/2017 Document Reviewed: 06/06/2017 Elsevier Patient Education  2021 Elsevier Inc.  

## 2020-12-03 NOTE — Progress Notes (Signed)
   PRENATAL VISIT NOTE  Subjective:  Nichole Soto is a 30 y.o. G3P1001 at 109w2d being seen today for ongoing prenatal care.  She is currently monitored for the following issues for this high-risk pregnancy and has Language barrier; History of malpresentation; History of cesarean delivery; Supervision of high risk pregnancy, antepartum; Bicornate uterus complicating pregnancy; History of premature rupture of membranes in previous pregnancy, currently pregnant; Gallstones; Right leg pain; Nonintractable headache; Spots in front of the eye; and LGA (large for gestational age) fetus affecting management of mother on their problem list.  Patient reports occasional contractions.  Contractions: Irregular. Vag. Bleeding: None.  Movement: Present. Denies leaking of fluid.   The following portions of the patient's history were reviewed and updated as appropriate: allergies, current medications, past family history, past medical history, past social history, past surgical history and problem list.   Objective:   Vitals:   12/03/20 1402  BP: 110/70  Pulse: 75  Weight: 163 lb 3.2 oz (74 kg)    Fetal Status: Fetal Heart Rate (bpm): 132 Fundal Height: 37 cm Movement: Present     General:  Alert, oriented and cooperative. Patient is in no acute distress.  Skin: Skin is warm and dry. No rash noted.   Cardiovascular: Normal heart rate noted  Respiratory: Normal respiratory effort, no problems with respiration noted  Abdomen: Soft, gravid, appropriate for gestational age.  Pain/Pressure: Absent     Pelvic: Cervical exam deferred        Extremities: Normal range of motion.  Edema: None  Mental Status: Normal mood and affect. Normal behavior. Normal judgment and thought content.   Assessment and Plan:  Pregnancy: G3P1001 at [redacted]w[redacted]d 1. Excessive fetal growth affecting management of pregnancy in third trimester, single or unspecified fetus Needs f/u US for growth  2. Supervision of high  risk pregnancy, antepartum   3. History of cesarean delivery Plans TOLAC, consent was signed  4. Language barrier Spanish interpreter Eda Royal  Preterm labor symptoms and general obstetric precautions including but not limited to vaginal bleeding, contractions, leaking of fluid and fetal movement were reviewed in detail with the patient. Please refer to After Visit Summary for other counseling recommendations.   Return in about 2 weeks (around 12/17/2020).  Future Appointments  Date Time Provider Department Center  12/07/2020  8:30 AM [facility] NURSE [facility]  12/07/2020  8:45 AM [facility]  12/18/2020  8:15 AM Adrian Blackwater, Rhona Raider, DO [facility]    Scheryl Darter, MD

## 2020-12-07 ENCOUNTER — Encounter: Payer: Self-pay | Admitting: *Deleted

## 2020-12-07 ENCOUNTER — Other Ambulatory Visit: Payer: Self-pay

## 2020-12-07 ENCOUNTER — Ambulatory Visit: Payer: Self-pay | Admitting: *Deleted

## 2020-12-07 ENCOUNTER — Ambulatory Visit: Payer: Self-pay | Attending: Obstetrics and Gynecology

## 2020-12-07 ENCOUNTER — Other Ambulatory Visit: Payer: Self-pay | Admitting: *Deleted

## 2020-12-07 DIAGNOSIS — Q513 Bicornate uterus: Secondary | ICD-10-CM

## 2020-12-07 DIAGNOSIS — O0990 Supervision of high risk pregnancy, unspecified, unspecified trimester: Secondary | ICD-10-CM | POA: Insufficient documentation

## 2020-12-07 DIAGNOSIS — Z3A34 34 weeks gestation of pregnancy: Secondary | ICD-10-CM

## 2020-12-07 DIAGNOSIS — O321XX0 Maternal care for breech presentation, not applicable or unspecified: Secondary | ICD-10-CM

## 2020-12-07 DIAGNOSIS — O34219 Maternal care for unspecified type scar from previous cesarean delivery: Secondary | ICD-10-CM

## 2020-12-07 DIAGNOSIS — Z3689 Encounter for other specified antenatal screening: Secondary | ICD-10-CM

## 2020-12-07 DIAGNOSIS — O34593 Maternal care for other abnormalities of gravid uterus, third trimester: Secondary | ICD-10-CM

## 2020-12-07 DIAGNOSIS — O09213 Supervision of pregnancy with history of pre-term labor, third trimester: Secondary | ICD-10-CM

## 2020-12-12 ENCOUNTER — Inpatient Hospital Stay (HOSPITAL_BASED_OUTPATIENT_CLINIC_OR_DEPARTMENT_OTHER): Payer: Medicaid Other

## 2020-12-12 ENCOUNTER — Encounter (HOSPITAL_COMMUNITY): Payer: Self-pay | Admitting: Obstetrics and Gynecology

## 2020-12-12 ENCOUNTER — Encounter (HOSPITAL_COMMUNITY)
Admission: AD | Disposition: A | Discharge status: Home / Self Care | Payer: Self-pay | Source: Home / Self Care | Attending: Obstetrics and Gynecology

## 2020-12-12 ENCOUNTER — Other Ambulatory Visit: Payer: Self-pay

## 2020-12-12 ENCOUNTER — Inpatient Hospital Stay (HOSPITAL_COMMUNITY)
Admission: AD | Admit: 2020-12-12 | Discharge: 2020-12-14 | DRG: 786 | Disposition: A | Discharge status: Home / Self Care | Payer: Medicaid Other | Attending: Obstetrics and Gynecology | Admitting: Obstetrics and Gynecology

## 2020-12-12 ENCOUNTER — Inpatient Hospital Stay (HOSPITAL_COMMUNITY): Payer: Medicaid Other | Admitting: Anesthesiology

## 2020-12-12 DIAGNOSIS — O321XX0 Maternal care for breech presentation, not applicable or unspecified: Secondary | ICD-10-CM | POA: Diagnosis present

## 2020-12-12 DIAGNOSIS — O34219 Maternal care for unspecified type scar from previous cesarean delivery: Secondary | ICD-10-CM

## 2020-12-12 DIAGNOSIS — O3400 Maternal care for unspecified congenital malformation of uterus, unspecified trimester: Secondary | ICD-10-CM

## 2020-12-12 DIAGNOSIS — O3403 Maternal care for unspecified congenital malformation of uterus, third trimester: Secondary | ICD-10-CM | POA: Diagnosis present

## 2020-12-12 DIAGNOSIS — E669 Obesity, unspecified: Secondary | ICD-10-CM | POA: Diagnosis present

## 2020-12-12 DIAGNOSIS — O99214 Obesity complicating childbirth: Secondary | ICD-10-CM | POA: Diagnosis present

## 2020-12-12 DIAGNOSIS — Z3A35 35 weeks gestation of pregnancy: Secondary | ICD-10-CM

## 2020-12-12 DIAGNOSIS — Q513 Bicornate uterus: Secondary | ICD-10-CM

## 2020-12-12 DIAGNOSIS — O09213 Supervision of pregnancy with history of pre-term labor, third trimester: Secondary | ICD-10-CM

## 2020-12-12 DIAGNOSIS — O34211 Maternal care for low transverse scar from previous cesarean delivery: Secondary | ICD-10-CM | POA: Diagnosis present

## 2020-12-12 DIAGNOSIS — O4693 Antepartum hemorrhage, unspecified, third trimester: Secondary | ICD-10-CM | POA: Diagnosis not present

## 2020-12-12 DIAGNOSIS — Z20822 Contact with and (suspected) exposure to covid-19: Secondary | ICD-10-CM | POA: Diagnosis present

## 2020-12-12 DIAGNOSIS — O34593 Maternal care for other abnormalities of gravid uterus, third trimester: Secondary | ICD-10-CM

## 2020-12-12 DIAGNOSIS — G8918 Other acute postprocedural pain: Secondary | ICD-10-CM | POA: Diagnosis not present

## 2020-12-12 DIAGNOSIS — O0990 Supervision of high risk pregnancy, unspecified, unspecified trimester: Secondary | ICD-10-CM

## 2020-12-12 DIAGNOSIS — O99893 Other specified diseases and conditions complicating puerperium: Secondary | ICD-10-CM | POA: Diagnosis not present

## 2020-12-12 DIAGNOSIS — O4593 Premature separation of placenta, unspecified, third trimester: Secondary | ICD-10-CM | POA: Diagnosis present

## 2020-12-12 LAB — CBC
HCT: 34.3 % — ABNORMAL LOW (ref 36.0–46.0)
HCT: 32.2 % — ABNORMAL LOW (ref 36.0–46.0)
Hemoglobin: 11.2 g/dL — ABNORMAL LOW (ref 12.0–15.0)
Hemoglobin: 11.1 g/dL — ABNORMAL LOW (ref 12.0–15.0)
MCH: 28.1 pg (ref 26.0–34.0)
MCH: 29.2 pg (ref 26.0–34.0)
MCHC: 32.7 g/dL (ref 30.0–36.0)
MCHC: 34.5 g/dL (ref 30.0–36.0)
MCV: 86.2 fL (ref 80.0–100.0)
MCV: 84.7 fL (ref 80.0–100.0)
Platelets: 225 10*3/uL (ref 150–400)
Platelets: 217 10*3/uL (ref 150–400)
RBC: 3.98 MIL/uL (ref 3.87–5.11)
RBC: 3.8 MIL/uL — ABNORMAL LOW (ref 3.87–5.11)
RDW: 14.3 % (ref 11.5–15.5)
RDW: 14.4 % (ref 11.5–15.5)
WBC: 7.8 10*3/uL (ref 4.0–10.5)
WBC: 7.7 10*3/uL (ref 4.0–10.5)
nRBC: 0 % (ref 0.0–0.2)
nRBC: 0 % (ref 0.0–0.2)

## 2020-12-12 LAB — DIC (DISSEMINATED INTRAVASCULAR COAGULATION)PANEL
D-Dimer, Quant: 2.48 ug/mL-FEU — ABNORMAL HIGH (ref 0.00–0.50)
Fibrinogen: 557 mg/dL — ABNORMAL HIGH (ref 210–475)
INR: 1 (ref 0.8–1.2)
Platelets: 220 10*3/uL (ref 150–400)
Prothrombin Time: 12.9 seconds (ref 11.4–15.2)
Smear Review: NONE SEEN
aPTT: 30 seconds (ref 24–36)

## 2020-12-12 LAB — TYPE AND SCREEN
ABO/RH(D): O POS
Antibody Screen: NEGATIVE

## 2020-12-12 LAB — SARS CORONAVIRUS 2 BY RT PCR (HOSPITAL ORDER, PERFORMED IN ~~LOC~~ HOSPITAL LAB): SARS Coronavirus 2: NEGATIVE

## 2020-12-12 SURGERY — Surgical Case
Anesthesia: Spinal

## 2020-12-12 SURGERY — Surgical Case
Anesthesia: Choice

## 2020-12-12 MED ORDER — KETOROLAC TROMETHAMINE 30 MG/ML IJ SOLN: 30.0000 mg | Freq: Four times a day (QID) | INTRAMUSCULAR | Status: AC | PRN

## 2020-12-12 MED ORDER — LACTATED RINGERS IV SOLN: INTRAVENOUS | Status: DC

## 2020-12-12 MED ORDER — SOD CITRATE-CITRIC ACID 500-334 MG/5ML PO SOLN
30.0000 mL | ORAL | Status: AC
  Administered 2020-12-12: 30 mL via ORAL
  Filled 2020-12-12: qty 15

## 2020-12-12 MED ORDER — ONDANSETRON HCL 4 MG/2ML IJ SOLN: 4.0000 mg | Freq: Three times a day (TID) | INTRAMUSCULAR | Status: DC | PRN

## 2020-12-12 MED ORDER — MEPERIDINE HCL 25 MG/ML IJ SOLN: 6.2500 mg | INTRAMUSCULAR | Status: DC | PRN

## 2020-12-12 MED ORDER — ACETAMINOPHEN 500 MG PO TABS: 1000.0000 mg | ORAL_TABLET | Freq: Four times a day (QID) | ORAL | Status: DC

## 2020-12-12 MED ORDER — PROMETHAZINE HCL 25 MG/ML IJ SOLN: 6.2500 mg | INTRAMUSCULAR | Status: DC | PRN

## 2020-12-12 MED ORDER — FENTANYL CITRATE (PF) 100 MCG/2ML IJ SOLN
INTRAMUSCULAR | Status: DC | PRN
  Administered 2020-12-12: 15 ug via INTRATHECAL

## 2020-12-12 MED ORDER — ZOLPIDEM TARTRATE 5 MG PO TABS: 5.0000 mg | ORAL_TABLET | Freq: Every evening | ORAL | Status: DC | PRN

## 2020-12-12 MED ORDER — ONDANSETRON HCL 4 MG/2ML IJ SOLN
INTRAMUSCULAR | Status: AC
  Filled 2020-12-12: qty 2

## 2020-12-12 MED ORDER — BETAMETHASONE SOD PHOS & ACET 6 (3-3) MG/ML IJ SUSP
12.0000 mg | INTRAMUSCULAR | Status: DC
  Administered 2020-12-12: 12 mg via INTRAMUSCULAR
  Filled 2020-12-12: qty 5

## 2020-12-12 MED ORDER — OXYCODONE HCL 5 MG PO TABS
5.0000 mg | ORAL_TABLET | Freq: Once | ORAL | Status: DC | PRN
Start: 2020-12-12 — End: 2020-12-13

## 2020-12-12 MED ORDER — NALBUPHINE HCL 10 MG/ML IJ SOLN: 5.0000 mg | Freq: Once | INTRAMUSCULAR | Status: DC | PRN

## 2020-12-12 MED ORDER — MORPHINE SULFATE (PF) 0.5 MG/ML IJ SOLN
INTRAMUSCULAR | Status: DC | PRN
  Administered 2020-12-12: .15 mg via INTRATHECAL

## 2020-12-12 MED ORDER — NALBUPHINE HCL 10 MG/ML IJ SOLN: 5.0000 mg | INTRAMUSCULAR | Status: DC | PRN

## 2020-12-12 MED ORDER — OXYTOCIN-SODIUM CHLORIDE 30-0.9 UT/500ML-% IV SOLN
INTRAVENOUS | Status: DC | PRN
  Administered 2020-12-12: 300 mL via INTRAVENOUS

## 2020-12-12 MED ORDER — ACETAMINOPHEN 325 MG PO TABS
650.0000 mg | ORAL_TABLET | ORAL | Status: DC | PRN
  Administered 2020-12-12: 650 mg via ORAL
  Filled 2020-12-12: qty 2

## 2020-12-12 MED ORDER — NALOXONE HCL 0.4 MG/ML IJ SOLN: 0.4000 mg | INTRAMUSCULAR | Status: DC | PRN

## 2020-12-12 MED ORDER — SCOPOLAMINE 1 MG/3DAYS TD PT72
1.0000 | MEDICATED_PATCH | Freq: Once | TRANSDERMAL | Status: DC
  Administered 2020-12-13: 1.5 mg via TRANSDERMAL
  Filled 2020-12-12: qty 1

## 2020-12-12 MED ORDER — DEXTROSE 5 % IV SOLN
1.0000 ug/kg/h | INTRAVENOUS | Status: DC | PRN
Start: 2020-12-12 — End: 2020-12-14
  Filled 2020-12-12: qty 5

## 2020-12-12 MED ORDER — CEFAZOLIN SODIUM-DEXTROSE 2-4 GM/100ML-% IV SOLN
INTRAVENOUS | Status: AC
  Filled 2020-12-12: qty 100

## 2020-12-12 MED ORDER — PHENYLEPHRINE HCL-NACL 20-0.9 MG/250ML-% IV SOLN
INTRAVENOUS | Status: AC
  Filled 2020-12-12: qty 250

## 2020-12-12 MED ORDER — KETOROLAC TROMETHAMINE 30 MG/ML IJ SOLN
INTRAMUSCULAR | Status: AC
  Filled 2020-12-12: qty 1

## 2020-12-12 MED ORDER — CEFAZOLIN SODIUM-DEXTROSE 2-4 GM/100ML-% IV SOLN
2.0000 g | INTRAVENOUS | Status: AC
  Administered 2020-12-12: 2 g via INTRAVENOUS

## 2020-12-12 MED ORDER — DEXAMETHASONE SODIUM PHOSPHATE 10 MG/ML IJ SOLN
INTRAMUSCULAR | Status: AC
  Filled 2020-12-12: qty 1

## 2020-12-12 MED ORDER — OXYCODONE HCL 5 MG/5ML PO SOLN
5.0000 mg | Freq: Once | ORAL | Status: DC | PRN
Start: 2020-12-12 — End: 2020-12-13

## 2020-12-12 MED ORDER — BUPIVACAINE IN DEXTROSE 0.75-8.25 % IT SOLN
INTRATHECAL | Status: DC | PRN
  Administered 2020-12-12: 1.4 mL via INTRATHECAL

## 2020-12-12 MED ORDER — HYDROMORPHONE HCL 1 MG/ML IJ SOLN: 0.2500 mg | INTRAMUSCULAR | Status: DC | PRN

## 2020-12-12 MED ORDER — ONDANSETRON HCL 4 MG/2ML IJ SOLN
INTRAMUSCULAR | Status: DC | PRN
  Administered 2020-12-12: 4 mg via INTRAVENOUS

## 2020-12-12 MED ORDER — LACTATED RINGERS IV BOLUS
500.0000 mL | Freq: Once | INTRAVENOUS | Status: AC
  Administered 2020-12-12: 500 mL via INTRAVENOUS

## 2020-12-12 MED ORDER — PHENYLEPHRINE HCL-NACL 20-0.9 MG/250ML-% IV SOLN
INTRAVENOUS | Status: DC | PRN
  Administered 2020-12-12: 60 ug/min via INTRAVENOUS

## 2020-12-12 MED ORDER — NALBUPHINE HCL 10 MG/ML IJ SOLN
5.0000 mg | Freq: Once | INTRAMUSCULAR | Status: DC | PRN
Start: 2020-12-12 — End: 2020-12-14

## 2020-12-12 MED ORDER — PRENATAL MULTIVITAMIN CH: 1.0000 | ORAL_TABLET | Freq: Every day | ORAL | Status: DC

## 2020-12-12 MED ORDER — MORPHINE SULFATE (PF) 0.5 MG/ML IJ SOLN
INTRAMUSCULAR | Status: AC
  Filled 2020-12-12: qty 10

## 2020-12-12 MED ORDER — CALCIUM CARBONATE ANTACID 500 MG PO CHEW: 2.0000 | CHEWABLE_TABLET | ORAL | Status: DC | PRN

## 2020-12-12 MED ORDER — OXYTOCIN-SODIUM CHLORIDE 30-0.9 UT/500ML-% IV SOLN
INTRAVENOUS | Status: AC
  Filled 2020-12-12: qty 500

## 2020-12-12 MED ORDER — DIPHENHYDRAMINE HCL 50 MG/ML IJ SOLN: 12.5000 mg | INTRAMUSCULAR | Status: DC | PRN

## 2020-12-12 MED ORDER — FENTANYL CITRATE (PF) 100 MCG/2ML IJ SOLN
INTRAMUSCULAR | Status: AC
  Filled 2020-12-12: qty 2

## 2020-12-12 MED ORDER — KETOROLAC TROMETHAMINE 30 MG/ML IJ SOLN
30.0000 mg | Freq: Once | INTRAMUSCULAR | Status: AC | PRN
  Administered 2020-12-12: 30 mg via INTRAVENOUS

## 2020-12-12 MED ORDER — DIPHENHYDRAMINE HCL 25 MG PO CAPS: 25.0000 mg | ORAL_CAPSULE | ORAL | Status: DC | PRN

## 2020-12-12 MED ORDER — DOCUSATE SODIUM 100 MG PO CAPS: 100.0000 mg | ORAL_CAPSULE | Freq: Every day | ORAL | Status: DC

## 2020-12-12 MED ORDER — SODIUM CHLORIDE 0.9% FLUSH: 3.0000 mL | INTRAVENOUS | Status: DC | PRN

## 2020-12-12 MED ORDER — DEXAMETHASONE SODIUM PHOSPHATE 10 MG/ML IJ SOLN
INTRAMUSCULAR | Status: DC | PRN
  Administered 2020-12-12: 10 mg via INTRAVENOUS

## 2020-12-12 SURGICAL SUPPLY — 34 items
BENZOIN TINCTURE PRP APPL 2/3 (GAUZE/BANDAGES/DRESSINGS) ×4 IMPLANT
CLOSURE STERI STRIP 1/2 X4 (GAUZE/BANDAGES/DRESSINGS) ×2 IMPLANT
CLOTH BEACON ORANGE TIMEOUT ST (SAFETY) ×2 IMPLANT
DRSG OPSITE POSTOP 4X10 (GAUZE/BANDAGES/DRESSINGS) ×4 IMPLANT
ELECT REM PT RETURN 9FT ADLT (ELECTROSURGICAL) ×2
ELECTRODE REM PT RTRN 9FT ADLT (ELECTROSURGICAL) ×1 IMPLANT
EXTRACTOR VACUUM KIWI (MISCELLANEOUS) IMPLANT
GAUZE SPONGE 4X4 12PLY STRL LF (GAUZE/BANDAGES/DRESSINGS) ×4 IMPLANT
GLOVE BIOGEL PI IND STRL 7.0 (GLOVE) ×1 IMPLANT
GLOVE BIOGEL PI INDICATOR 7.0 (GLOVE) ×1
GLOVE SURG ORTHO 8.0 STRL STRW (GLOVE) ×2 IMPLANT
GOWN STRL REUS W/TWL LRG LVL3 (GOWN DISPOSABLE) ×4 IMPLANT
KIT ABG SYR 3ML LUER SLIP (SYRINGE) IMPLANT
NEEDLE HYPO 25X5/8 SAFETYGLIDE (NEEDLE) IMPLANT
NS IRRIG 1000ML POUR BTL (IV SOLUTION) ×2 IMPLANT
PACK C SECTION WH (CUSTOM PROCEDURE TRAY) ×2 IMPLANT
PAD ABD 7.5X8 STRL (GAUZE/BANDAGES/DRESSINGS) ×2 IMPLANT
PAD OB MATERNITY 4.3X12.25 (PERSONAL CARE ITEMS) ×2 IMPLANT
PENCIL SMOKE EVAC W/HOLSTER (ELECTROSURGICAL) ×2 IMPLANT
RTRCTR C-SECT PINK 25CM LRG (MISCELLANEOUS) IMPLANT
SPONGE GAUZE 4X4 12PLY STER LF (GAUZE/BANDAGES/DRESSINGS) ×2 IMPLANT
STRIP CLOSURE SKIN 1/2X4 (GAUZE/BANDAGES/DRESSINGS) ×2 IMPLANT
SUT MON AB-0 CT1 36 (SUTURE) ×4 IMPLANT
SUT PLAIN 0 NONE (SUTURE) IMPLANT
SUT PLAIN 2 0 (SUTURE) ×1
SUT PLAIN ABS 2-0 CT1 27XMFL (SUTURE) ×1 IMPLANT
SUT VIC AB 0 CT1 27 (SUTURE) ×2
SUT VIC AB 0 CT1 27XBRD ANBCTR (SUTURE) ×2 IMPLANT
SUT VIC AB 2-0 CT1 27 (SUTURE) ×1
SUT VIC AB 2-0 CT1 TAPERPNT 27 (SUTURE) ×1 IMPLANT
SUT VIC AB 4-0 KS 27 (SUTURE) ×2 IMPLANT
TOWEL OR 17X24 6PK STRL BLUE (TOWEL DISPOSABLE) ×2 IMPLANT
TRAY FOLEY W/BAG SLVR 14FR LF (SET/KITS/TRAYS/PACK) ×2 IMPLANT
WATER STERILE IRR 1000ML POUR (IV SOLUTION) ×2 IMPLANT

## 2020-12-12 NOTE — MAU Note (Signed)
Spec exam repeated per CNM dark red blood pooled in speculum. Plan of care and findings discussed with patient and SO with spanish interpreter. Pt voiced understanding and denies questions. FHR remains Cat 1.

## 2020-12-12 NOTE — Transfer of Care (Signed)
Immediate Anesthesia Transfer of Care Note  Patient: Nichole Soto  Procedure(s) Performed: CESAREAN SECTION  Patient Location: PACU  Anesthesia Type:Spinal  Level of Consciousness: awake, alert  and oriented  Airway & Oxygen Therapy: Patient Spontanous Breathing  Post-op Assessment: Report given to RN and Post -op Vital signs reviewed and stable  Post vital signs: Reviewed and stable  Last Vitals:  Vitals Value Taken Time  BP 111/61 12/12/20 2236  Temp 37.3 C 12/12/20 2236  Pulse 73 12/12/20 2238  Resp 18 12/12/20 2238  SpO2 100 % 12/12/20 2238  Vitals shown include unvalidated device data.  Last Pain:  Vitals:   12/12/20 2000  TempSrc:   PainSc: 10-Worst pain ever         Complications: No complications documented.

## 2020-12-12 NOTE — Anesthesia Procedure Notes (Signed)
Spinal  Patient location during procedure: OR Start time: 12/12/2020 9:16 PM End time: 12/12/2020 9:20 PM Staffing Performed: anesthesiologist  Anesthesiologist: Lannie Fields, DO Preanesthetic Checklist Completed: patient identified, IV checked, risks and benefits discussed, surgical consent, monitors and equipment checked, pre-op evaluation and timeout performed Spinal Block Patient position: sitting Prep: DuraPrep and site prepped and draped Patient monitoring: cardiac monitor, continuous pulse ox and blood pressure Approach: midline Location: L3-4 Injection technique: single-shot Needle Needle type: Pencan  Needle gauge: 24 G Needle length: 9 cm Assessment Sensory level: T6 Additional Notes Functioning IV was confirmed and monitors were applied. Sterile prep and drape, including hand hygiene and sterile gloves were used. The patient was positioned and the spine was prepped. The skin was anesthetized with lidocaine.  Free flow of clear CSF was obtained prior to injecting local anesthetic into the CSF.  The spinal needle aspirated freely following injection.  The needle was carefully withdrawn.  The patient tolerated the procedure well.

## 2020-12-12 NOTE — H&P (Signed)
History     CSN: [id number]  Arrival date and time: 12/12/20 1345   Event Date/Time   First Provider Initiated Contact with Patient 12/12/20 1420      Chief Complaint  Patient presents with  . Vaginal Bleeding   HPI. Nichole Soto is a 30 y.o. G3P1001 at [redacted]w[redacted]d who presents with vaginal bleeding. Upon arrival to MAU, she had bright red blood running down the inside of her things. CNM was called to room immediately for assessment. Patient reports about an hour ago she had a gush of bright red bleeding and has continued to have bleeding. She denies any pain. She reports normal fetal movement. She has a history of a c/s for twins in with her first delivery and denies any problems in this pregnancy.   OB History    Gravida  3   Para  2   Term  1   Preterm  0   AB  0   Living  1     SAB  0   IAB  0   Ectopic  0   Multiple  0   Live Births  2        Obstetric Comments  G1: 2012, term c/s for transverse. Pt never told she couldn't labor in the future.         Past Medical History:  Diagnosis Date  . 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 Preterm premature rupture of membranes 08/27/2019   Dx on 10/12 with mfm anatomy u/s  . 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 CESAREAN SECTION      Family History  Problem Relation Age of Onset  . Cancer Neg Hx   . Diabetes Neg Hx   . Hypertension Neg Hx     Social History   Tobacco Use  . Smoking status: Never Smoker  . Smokeless tobacco: Never Used  Vaping Use  . Vaping Use: Never used  Substance Use Topics  . Alcohol use: Not Currently  . Drug use: Never    Allergies: No Known Allergies  Medications Prior to Admission  Medication Sig Dispense Refill Last Dose  . aspirin EC 81 MG  tablet Take 1 tablet (81 mg total) by mouth daily. Take after 12 weeks for prevention of preeclampsia later in pregnancy 300 tablet 2 Past Month at Unknown time  . Prenatal Vit-Fe Fumarate-FA (PRENATAL VITAMINS PO) Take 1 tablet by mouth daily.       Review of Systems  Constitutional: Negative.  Negative for fatigue and fever.  HENT: Negative.   Respiratory: Negative.  Negative for shortness of breath.   Cardiovascular: Negative.  Negative for chest pain.  Gastrointestinal: Negative.  Negative for abdominal pain, constipation, diarrhea, nausea and vomiting.  Genitourinary: Positive for vaginal bleeding. Negative for dysuria and vaginal discharge.  Neurological: Negative.  Negative for dizziness and headaches.   Physical Exam   Blood pressure 124/82, pulse 97, temperature 98.3 F (36.8 C), temperature source Oral, resp. rate 15, weight 74.6 kg, last menstrual period 04/07/2020, SpO2 100 %, unknown if currently breastfeeding.  Physical Exam Vitals and nursing note reviewed.  Constitutional:      General: She is not in acute distress.    Appearance: She is well-developed and well-nourished.  HENT:     Head: Normocephalic.  Eyes:     Pupils: Pupils are equal, round, and reactive to light.  Cardiovascular:     Rate and Rhythm: Normal rate and regular rhythm.     Heart sounds: Normal heart sounds.  Pulmonary:     Effort: Pulmonary effort is normal. No respiratory distress.     Breath sounds: Normal breath sounds.  Abdominal:     General: Bowel sounds are normal. There is no distension.     Palpations: Abdomen is soft.     Tenderness: There is no abdominal tenderness.  Genitourinary:    Vagina: Bleeding present.     Cervix: Dilated.  Skin:    General: Skin is warm and dry.  Neurological:     Mental Status: She is alert and oriented to person, place, and time.  Psychiatric:        Mood and Affect: Mood and affect normal.        Behavior: Behavior normal.        Thought Content:  Thought content normal.        Judgment: Judgment normal.    Fetal Tracing:  Baseline: 120 Variability: moderate Accels: 15x15 Decels: none  Toco: ui  Dilation: 1 Exam by:: Dr.Bass   MAU Course  Procedures Results for orders placed or performed during the hospital encounter of 12/12/20 (from the past 24 hour(s))  CBC     Status: Abnormal   Collection Time: 12/12/20  2:49 PM  Result Value Ref Range   WBC 7.8 4.0 - 10.5 K/uL   RBC 3.98 3.87 - 5.11 MIL/uL   Hemoglobin 11.2 (L) 12.0 - 15.0 g/dL   HCT 25.4 (L) 27.0 - 62.3 %   MCV 86.2 80.0 - 100.0 fL   MCH 28.1 26.0 - 34.0 pg   MCHC 32.7 30.0 - 36.0 g/dL   RDW 76.2 83.1 - 51.7 %   Platelets 225 150 - 400 K/uL   nRBC 0.0 0.0 - 0.2 %  Type and screen [facility]     Status: None   Collection Time: 12/12/20  2:49 PM  Result Value Ref Range   ABO/RH(D) O POS    Antibody Screen NEG    Sample Expiration      12/15/2020,2359 Performed at [facility], [address], [place], [place] [address]   SARS Coronavirus 2 by RT PCR (hospital order, performed in [facility] hospital lab) Nasopharyngeal Nasopharyngeal Swab     Status: None   Collection Time: 12/12/20  2:49 PM   Specimen: Nasopharyngeal Swab  Result Value Ref Range   SARS Coronavirus 2 NEGATIVE NEGATIVE  DIC Panel (Not at [facility]) ONCE - STAT     Status: Abnormal   Collection Time: 12/12/20  3:03 PM  Result Value Ref Range   Prothrombin Time 12.9 11.4 - 15.2 seconds   INR 1.0 0.8 - 1.2   aPTT 30 24 - 36 seconds   Fibrinogen 557 (H) 210 - 475 mg/dL   D-Dimer, Quant 3.71 (H) 0.00 - 0.50 ug/mL-FEU   Platelets 220 150 - 400 K/uL   Smear Review NO SCHISTOCYTES SEEN    MDM CNM at bedside for pelvic exam- SSE showed large clot with pooling of bright red blood in vaginal vault. Brisk bright red bleeding from cervical os.   Dr. Donavan Foil notified and order for bedside ultrasound placed.   [place] MFM OB Limited- posterior placenta, breech presentation, normal  AFI, no evidence of previa or abruption.  DIC Panel CBC Type and Screen COVID test  Upon reassessment of bleeding, speculum filled with bright red bleeding, cleared with fox swabs and small trickle of bright red blood from cervical os. Report given to Dr. Donavan Foil- will come repeat speculum exam  Assessment and Plan  -Vaginal bleeding in the third trimester  -Admit to [facility] -Care turned over to Dr. Donavan Foil.  Rolm Bookbinder 12/12/2020, 2:21 PM

## 2020-12-12 NOTE — MAU Note (Signed)
Spanish interpreter in -pt agreeable to lab draw and evaluation of current vaginal bleeding- peri pad clear without bleeding or discharge-abdomen soft and non tender to palpation. FHR remains Cat1

## 2020-12-12 NOTE — Anesthesia Preprocedure Evaluation (Addendum)
Anesthesia Evaluation  Patient identified by MRN, date of birth, ID band Patient awake    Reviewed: Allergy & Precautions, NPO status , Patient's Chart, lab work & pertinent test results  Airway Mallampati: III  TM Distance: >3 FB Neck ROM: Full  Mouth opening: Limited Mouth Opening  Dental  (+) Teeth Intact, Dental Advisory Given Multiple silver-capped teeth:   Pulmonary neg pulmonary ROS,    Pulmonary exam normal breath sounds clear to auscultation       Cardiovascular negative cardio ROS Normal cardiovascular exam Rhythm:Regular Rate:Normal     Neuro/Psych  Headaches, negative psych ROS   GI/Hepatic negative GI ROS, Neg liver ROS,   Endo/Other  Obesity BMI 36  Renal/GU negative Renal ROS  negative genitourinary   Musculoskeletal negative musculoskeletal ROS (+)   Abdominal   Peds negative pediatric ROS (+)  Hematology  (+) Blood dyscrasia, anemia , hct 32.2, plt 217   Anesthesia Other Findings   Reproductive/Obstetrics (+) Pregnancy G3P1, 1 prior section 2020 Presented to MAU w/ slow abruption, 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                             Anesthesia Physical Anesthesia Plan  ASA: III and emergent  Anesthesia Plan: Spinal   Post-op Pain Management:    Induction:   PONV Risk Score and Plan: 3 and Ondansetron, Dexamethasone and Treatment may vary due to age or medical condition  Airway Management Planned: Natural Airway and Nasal Cannula  Additional Equipment: None  Intra-op Plan:   Post-operative Plan:   Informed Consent: I have reviewed the patients History and Physical, chart, labs and discussed the procedure including the risks, benefits and alternatives for the proposed anesthesia with the patient or authorized representative who has indicated his/her understanding and acceptance.       Plan Discussed with: CRNA  Anesthesia Plan Comments:          Anesthesia Quick Evaluation

## 2020-12-12 NOTE — Progress Notes (Signed)
Subjective: Called by nursing staff that patient was having worsening pain with contractions.  Pt seen in person.  She states the contractions are q 3-4 minutes and much worse than previous.  She denies any further vaginal bleeding.  Fetal tracing is category 1.  With recent history of vaginal bleeding and now contractions, there is some concern for abruption.  Breech presentation seen earlier and confirmed with bedside ultrasound.  Will proceed with repeat cesarean section.  The risks of surgery were discussed with the patient including but were not limited to: bleeding which may require transfusion or reoperation; infection which may require antibiotics; injury to bowel, bladder, ureters or other surrounding organs; injury to the fetus; need for additional procedures including hysterectomy in the event of a life-threatening hemorrhage; formation of adhesions; placental abnormalities wth subsequent pregnancies; incisional problems; thromboembolic phenomenon and other postoperative/anesthesia complications.  The patient concurred with the proposed plan, giving informed written consent for the procedure.   Patient has been NPO since 10 AM she will remain NPO for procedure. Anesthesia and OR aware. Preoperative prophylactic antibiotics and SCDs ordered on call to the OR.  To OR when ready.    Objective: Vital signs in last 24 hours: Temp:  [98.2 F (36.8 C)-98.8 F (37.1 C)] 98.8 F (37.1 C) (01/29 1933) Pulse Rate:  [74-97] 78 (01/29 1933) Resp:  [15-18] 18 (01/29 1933) BP: (110-124)/(66-82) 117/72 (01/29 1933) SpO2:  [99 %-100 %] 100 % (01/29 1933) Weight:  [74.6 kg] 74.6 kg (01/29 1403) Weight change:   Fetal tracing:  120's reactive, no decels Toco: q 3-4 minutes Lab Results: Recent Labs    12/12/20 1449 12/12/20 1503 12/12/20 1831  WBC 7.8  --  7.7  HGB 11.2*  --  11.1*  HCT 34.3*  --  32.2*  PLT 225 220 217   I have reviewed the patient's current  medications.  Assessment/Plan: 35.4 weeks breech presentation, preterm labor, vaginal bleeding Pt has received one dose of steroids. Will proceed with repeat cesarean section.  Risks and benefits have been given including bleeding, infection, and involvement of other organs.  Consent signed.  OR, anesthesia and Neonatology are aware.    LOS: 0 days   Warden Fillers 12/12/2020,8:42 PM

## 2020-12-12 NOTE — Progress Notes (Signed)
Profend Nasal Swab given @2052 .

## 2020-12-12 NOTE — Progress Notes (Signed)
Interpreter Charyl Bigger [phone] used to received consent from patient for surgery w/ Dr. Donavan Foil and Anesthesiologist.

## 2020-12-12 NOTE — MAU Note (Signed)
Dr.Bass in to evaluate pt-hospital interpreter present(Sylvia). Speculum exam completed.

## 2020-12-12 NOTE — Op Note (Signed)
Operative Note   SURGERY DATE: 12/12/2020  PRE-OP DIAGNOSIS:  * Pregnancy at [redacted]w[redacted]d  * Concern for placental abruption  * History of one prior c section * Breech presentation   POST-OP DIAGNOSIS: Same, no overt abruption noted    PROCEDURE: Repeat low transverse cesarean section via pfannenstiel skin incision with double layer uterine closure  SURGEON: Surgeon(s) and Role:    Warden Fillers, MD - Primary    * Myriam Jacobson Arlana Pouch, MD - Assisting   ANESTHESIA: spinal  ESTIMATED BLOOD LOSS: 1406  DRAINS: UOP via indwelling foley  TOTAL IV FLUIDS: crystalloid  VTE PROPHYLAXIS: SCDs to bilateral lower extremities  ANTIBIOTICS: Two grams of Cefazolin were given., within 1 hour of skin incision  SPECIMENS: Placenta to L&D   COMPLICATIONS: none  INDICATIONS: Concern for placental abruption, breech presentation   FINDINGS: No intra-abdominal adhesions were noted. Grossly normal uterus, tubes and ovaries. clear amniotic fluid, breech female infant, weight pending, APGARs 8/8, intact placenta.  PROCEDURE IN DETAIL: The patient was taken to the operating room where anesthesia was administered and normal fetal heart tones were confirmed. She was then prepped and draped in the normal fashion in the dorsal supine position with a leftward tilt.  After a time out was performed, a pfannensteil  skin incision was made with the scalpel and carried through to the underlying layer of fascia. The fascia was then incised at the midline and this incision was extended laterally with the mayo scissors. Attention was turned to the superior aspect of the fascial incision which was grasped with the kocher clamps x 2, tented up and the rectus muscles were dissected off with the mayo scissors. In a similar fashion the inferior aspect of the fascial incision was grasped with the kocher clamps, tented up and the rectus muscles dissected off with the mayo scissors. The rectus muscles were then  separated in the midline and the peritoneum was entered bluntly. The alexis retractor was inserted and the vesicouterine peritoneum was identified, tented up and entered with the metzenbaum scissors. This incision was extended laterally and the bladder flap was created digitally.    A low transverse hysterotomy was made with the scalpel until the endometrial cavity was breached and the amniotic sac ruptured with the Allis clamp, yielding clear amniotic fluid. This incision was extended bluntly and the infant was delivered using breech maneuvers atraumatically.The cord was clamped x 2 and cut, and the infant was handed to the awaiting pediatricians, after delayed cord clamping was done.  The placenta was then gradually expressed from the uterus and then the uterus was exteriorized and cleared of all clots and debris. The hysterotomy was repaired with a running suture of 1-0 vicryl. A second imbricating layer of 1-0 vicryl suture was then placed.     The uterus and adnexa were then returned to the abdomen, and the hysterotomy and all operative sites were reinspected and excellent hemostasis was noted after irrigation and suction of the abdomen with warm saline.  The peritoneum was closed with a running stitch of 2-0 Vicryl. The fascia was reapproximated with 0 Vicryl in a simple running fashion bilaterally. The subcutaneous layer was then reapproximated with interrupted sutures of 2-0 plain gut, and the skin was then closed with 4-0 vicryl, in a subcuticular fashion.  The patient  tolerated the procedure well. Sponge, lap, needle, and instrument counts were correct x 2. The patient was transferred to the recovery room awake, alert and breathing independently in stable  condition.   Casper Harrison, MD [facility] Family Medicine Fellow, [facility] for [facility], [facility]

## 2020-12-12 NOTE — Discharge Summary (Signed)
Postpartum Discharge Summary      Patient Name: Nichole Soto DOB: [birth year] MRN: [id number]  Date of admission: 12/12/2020 Delivery date:12/12/2020  Delivering provider: Warden Fillers  Date of discharge: 12/14/2020  Admitting diagnosis: Vaginal bleeding in pregnancy, third trimester [O46.93] Intrauterine pregnancy: [redacted]w[redacted]d     Secondary diagnosis:  Active Problems:   Bicornate uterus complicating pregnancy   Vaginal bleeding in pregnancy, third trimester   Cesarean delivery delivered      Discharge diagnosis: Preterm Pregnancy Delivered                                              Post partum procedures: none Augmentation: N/A Complications: Placental Abruption  Hospital course: Sceduled C/S   30 y.o. yo G3P1001 at [redacted]w[redacted]d was admitted to the hospital 12/12/2020 for vaginal bleeding and contraction. She underwent cesarean section with the following indication:Malpresentation, concern for placental abruption. Delivery details are as follows:  Membrane Rupture Time/Date:  ,   Delivery Method:C-Section, Low Transverse  Details of operation can be found in separate operative note.  Patient had an uncomplicated postpartum course.  She is ambulating, tolerating a regular diet, passing flatus, and urinating well. Patient is discharged home in stable condition on  12/14/20        Newborn Data: Birth date:12/12/2020  Birth time:9:47 PM  Gender:Female  Living status:Living  Apgars:8 ,8  Weight:2970 g     Magnesium Sulfate received: No BMZ received: No Rhophylac:N/A MMR:N/A T-DaP:Given prenatally Flu: Yes Transfusion:No  Physical exam  Vitals:   12/13/20 1514 12/13/20 1925 12/13/20 2320 12/14/20 0642  BP: (!) 99/50 (!) 101/54 (!) 104/59 104/63  Pulse: 75 75 67 75  Resp: 17 18 18 18   Temp: 99 F (37.2 C) (!) 97.3 F (36.3 C) 97.8 F (36.6 C) 97.8 F (36.6 C)  TempSrc: Oral Oral Oral Oral  SpO2: 97% 98% 99% 99%  Weight:      Height:       General: alert,  cooperative and no distress Lochia: appropriate Uterine Fundus: firm Incision: Dressing is clean, dry, and intact DVT Evaluation: No evidence of DVT seen on physical exam. No cords or calf tenderness. Labs: Lab Results  Component Value Date   WBC 14.0 (H) 12/13/2020   HGB 10.5 (L) 12/13/2020   HCT 30.6 (L) 12/13/2020   MCV 84.8 12/13/2020   PLT 214 12/13/2020   CMP Latest Ref Rng & Units 12/13/2020  Glucose 70 - 99 mg/dL -  BUN 6 - 20 mg/dL -  Creatinine 3.66 - 4.40 mg/dL 3.47  Sodium 425 - 956 mmol/L -  Potassium 3.5 - 5.1 mmol/L -  Chloride 98 - 111 mmol/L -  CO2 22 - 32 mmol/L -  Calcium 8.9 - 10.3 mg/dL -  Total Protein 6.5 - 8.1 g/dL -  Total Bilirubin 0.3 - 1.2 mg/dL -  Alkaline Phos 38 - 387 U/L -  AST 15 - 41 U/L -  ALT 0 - 44 U/L -   Edinburgh Score: Edinburgh Postnatal Depression Scale Screening Tool 12/13/2020  I have been able to laugh and see the funny side of things. 0  I have looked forward with enjoyment to things. 0  I have blamed myself unnecessarily when things went wrong. 2  I have been anxious or worried for no good reason. 0  I have felt scared or  panicky for no good reason. 0  Things have been getting on top of me. 0  I have been so unhappy that I have had difficulty sleeping. 0  I have felt sad or miserable. 0  I have been so unhappy that I have been crying. 0  The thought of harming myself has occurred to me. 0  Edinburgh Postnatal Depression Scale Total 2     After visit meds:  Allergies as of 12/14/2020   No Known Allergies     Medication List    STOP taking these medications   aspirin EC 81 MG tablet     TAKE these medications   ibuprofen 600 MG tablet Commonly known as: ADVIL Take 1 tablet (600 mg total) by mouth every 6 (six) hours as needed.   oxyCODONE 5 MG immediate release tablet Commonly known as: Oxy IR/ROXICODONE Take 1 tablet (5 mg total) by mouth every 4 (four) hours as needed for moderate pain.   PRENATAL VITAMINS  PO Take 1 tablet by mouth daily.        Discharge home in stable condition Infant Feeding: Breast Infant Disposition:home with mother Discharge instruction: per After Visit Summary and Postpartum booklet. Activity: Advance as tolerated. Pelvic rest for 6 weeks.  Diet: routine diet Future Appointments: Future Appointments  Date Time Provider Department Center  12/18/2020  8:15 AM Noralee Ch[facility]  12/28/2020  7:15 AM [facility] NURSE [facility]  12/28/2020  7:30 AM [facility] US3 [facility]   Follow up Visit:  Follow-up Information    [facility] Follow up on 12/18/2020.   Specialty: Obstetrics and Gynecology Contact information: [address] [place] [address] [phone]               Please schedule this patient for a In person postpartum visit in 6 weeks with the following provider: Any provider. Additional Postpartum F/U:Incision check 1 week  Low risk pregnancy complicated by: hx of prior c section  Delivery mode:  C-Section, Low Transverse  Anticipated Birth Control:  Depo given prior to discharge on 12/14/20   12/14/2020 Catalina Antigua, MD

## 2020-12-13 ENCOUNTER — Encounter (HOSPITAL_COMMUNITY): Payer: Self-pay | Admitting: Obstetrics and Gynecology

## 2020-12-13 DIAGNOSIS — O99893 Other specified diseases and conditions complicating puerperium: Secondary | ICD-10-CM

## 2020-12-13 DIAGNOSIS — Z98891 History of uterine scar from previous surgery: Secondary | ICD-10-CM

## 2020-12-13 DIAGNOSIS — O321XX0 Maternal care for breech presentation, not applicable or unspecified: Secondary | ICD-10-CM

## 2020-12-13 DIAGNOSIS — G8918 Other acute postprocedural pain: Secondary | ICD-10-CM

## 2020-12-13 LAB — CBC
HCT: 30.6 % — ABNORMAL LOW (ref 36.0–46.0)
Hemoglobin: 10.5 g/dL — ABNORMAL LOW (ref 12.0–15.0)
MCH: 29.1 pg (ref 26.0–34.0)
MCHC: 34.3 g/dL (ref 30.0–36.0)
MCV: 84.8 fL (ref 80.0–100.0)
Platelets: 214 10*3/uL (ref 150–400)
RBC: 3.61 MIL/uL — ABNORMAL LOW (ref 3.87–5.11)
RDW: 14.1 % (ref 11.5–15.5)
WBC: 14 10*3/uL — ABNORMAL HIGH (ref 4.0–10.5)
nRBC: 0 % (ref 0.0–0.2)

## 2020-12-13 LAB — CREATININE, SERUM
Creatinine, Ser: 0.47 mg/dL (ref 0.44–1.00)
GFR, Estimated: >60 mL/min (ref >60)

## 2020-12-13 LAB — RPR: RPR Ser Ql: NONREACTIVE

## 2020-12-13 MED ORDER — SIMETHICONE 80 MG PO CHEW: 80.0000 mg | CHEWABLE_TABLET | ORAL | Status: DC | PRN

## 2020-12-13 MED ORDER — PRENATAL MULTIVITAMIN CH
1.0000 | ORAL_TABLET | Freq: Every day | ORAL | Status: DC
  Administered 2020-12-13 – 2020-12-14 (×2): 1 via ORAL
  Filled 2020-12-13 (×2): qty 1

## 2020-12-13 MED ORDER — DIBUCAINE (PERIANAL) 1 % EX OINT: 1.0000 "application " | TOPICAL_OINTMENT | CUTANEOUS | Status: DC | PRN

## 2020-12-13 MED ORDER — MENTHOL 3 MG MT LOZG: 1.0000 | LOZENGE | OROMUCOSAL | Status: DC | PRN

## 2020-12-13 MED ORDER — KETOROLAC TROMETHAMINE 30 MG/ML IJ SOLN
30.0000 mg | Freq: Four times a day (QID) | INTRAMUSCULAR | Status: AC
  Administered 2020-12-13 (×3): 30 mg via INTRAVENOUS
  Filled 2020-12-13 (×3): qty 1

## 2020-12-13 MED ORDER — OXYTOCIN-SODIUM CHLORIDE 30-0.9 UT/500ML-% IV SOLN
2.5000 [IU]/h | INTRAVENOUS | Status: AC
  Administered 2020-12-13: 2.5 [IU]/h via INTRAVENOUS
  Filled 2020-12-13: qty 500

## 2020-12-13 MED ORDER — ENOXAPARIN SODIUM 40 MG/0.4ML ~~LOC~~ SOLN
40.0000 mg | SUBCUTANEOUS | Status: DC
  Administered 2020-12-13 – 2020-12-14 (×2): 40 mg via SUBCUTANEOUS
  Filled 2020-12-13 (×2): qty 0.4

## 2020-12-13 MED ORDER — FERROUS SULFATE 325 (65 FE) MG PO TABS
325.0000 mg | ORAL_TABLET | ORAL | Status: DC
  Administered 2020-12-13: 325 mg via ORAL
  Filled 2020-12-13: qty 1

## 2020-12-13 MED ORDER — SIMETHICONE 80 MG PO CHEW
80.0000 mg | CHEWABLE_TABLET | Freq: Three times a day (TID) | ORAL | Status: DC
  Administered 2020-12-13 – 2020-12-14 (×6): 80 mg via ORAL
  Filled 2020-12-13 (×7): qty 1

## 2020-12-13 MED ORDER — SENNOSIDES-DOCUSATE SODIUM 8.6-50 MG PO TABS
2.0000 | ORAL_TABLET | ORAL | Status: DC
  Administered 2020-12-13 – 2020-12-14 (×2): 2 via ORAL
  Filled 2020-12-13 (×2): qty 2

## 2020-12-13 MED ORDER — IBUPROFEN 800 MG PO TABS
800.0000 mg | ORAL_TABLET | Freq: Four times a day (QID) | ORAL | Status: DC
  Administered 2020-12-13 – 2020-12-14 (×4): 800 mg via ORAL
  Filled 2020-12-13 (×4): qty 1

## 2020-12-13 MED ORDER — LACTATED RINGERS IV SOLN: INTRAVENOUS | Status: DC

## 2020-12-13 MED ORDER — COCONUT OIL OIL: 1.0000 "application " | TOPICAL_OIL | Status: DC | PRN

## 2020-12-13 MED ORDER — TETANUS-DIPHTH-ACELL PERTUSSIS 5-2.5-18.5 LF-MCG/0.5 IM SUSY: 0.5000 mL | PREFILLED_SYRINGE | Freq: Once | INTRAMUSCULAR | Status: DC

## 2020-12-13 MED ORDER — DIPHENHYDRAMINE HCL 25 MG PO CAPS: 25.0000 mg | ORAL_CAPSULE | Freq: Four times a day (QID) | ORAL | Status: DC | PRN

## 2020-12-13 MED ORDER — MEDROXYPROGESTERONE ACETATE 150 MG/ML IM SUSP
150.0000 mg | INTRAMUSCULAR | Status: AC | PRN
  Administered 2020-12-14: 150 mg via INTRAMUSCULAR
  Filled 2020-12-13: qty 1

## 2020-12-13 MED ORDER — MEASLES, MUMPS & RUBELLA VAC IJ SOLR: 0.5000 mL | Freq: Once | INTRAMUSCULAR | Status: DC

## 2020-12-13 MED ORDER — WITCH HAZEL-GLYCERIN EX PADS: 1.0000 "application " | MEDICATED_PAD | CUTANEOUS | Status: DC | PRN

## 2020-12-13 MED ORDER — OXYCODONE HCL 5 MG PO TABS
5.0000 mg | ORAL_TABLET | ORAL | Status: DC | PRN
  Administered 2020-12-13 – 2020-12-14 (×3): 5 mg via ORAL
  Filled 2020-12-13 (×3): qty 1

## 2020-12-13 MED ORDER — ACETAMINOPHEN 500 MG PO TABS
1000.0000 mg | ORAL_TABLET | Freq: Three times a day (TID) | ORAL | Status: DC
  Administered 2020-12-13 – 2020-12-14 (×6): 1000 mg via ORAL
  Filled 2020-12-13 (×7): qty 2

## 2020-12-13 NOTE — Progress Notes (Signed)
Postpartum Day 1: Repeat Cesarean Delivery at [redacted]w[redacted]d due to bleeding and concern about abruption, breech presentation  Subjective: Patient is Spanish-speaking only, interpreter present for this encounter. Patient reports incisional pain, tolerating PO. No spontaneous void yet, foley was recently removed.  No flatus yet. No other concerns.  Objective: Vital signs in last 24 hours: Temp:  [98 F (36.7 C)-99.2 F (37.3 C)] 99.2 F (37.3 C) (01/30 0732) Pulse Rate:  [66-97] 70 (01/30 0732) Resp:  [14-22] 18 (01/30 0732) BP: (92-124)/(46-82) 101/46 (01/30 0732) SpO2:  [98 %-100 %] 100 % (01/30 0732) Weight:  [74.6 kg] 74.6 kg (01/29 1403)  Physical Exam:  General: alert and no distress Lochia: appropriate Uterine Fundus: firm Incision: dressing in place DVT Evaluation: No evidence of DVT seen on physical exam.  Negative Homan's sign.  No cords or calf tenderness. No significant calf/ankle edema.  Recent Labs    12/12/20 1831 12/13/20 0522  HGB 11.1* 10.5*  HCT 32.2* 30.6*    Assessment/Plan: Status post Cesarean section. Doing well postoperatively.  Breastfeeding, lactation support as needed Declines circumcision for baby boy. Depo Provera for contraception Continue current care Discharge to home tomorrow if remains stable. She has a lot of questions, seems not knowledgeable about car seats etc needed for baby, SW consult ordered.  Jaynie Collins, MD 12/13/2020, 11:21 AM

## 2020-12-13 NOTE — Anesthesia Postprocedure Evaluation (Signed)
Anesthesia Post Note  Patient: Samani Deal  Procedure(s) Performed: CESAREAN SECTION     Patient location during evaluation: PACU Anesthesia Type: Spinal Level of consciousness: awake and alert and oriented Pain management: pain level controlled Vital Signs Assessment: post-procedure vital signs reviewed and stable Respiratory status: spontaneous breathing, nonlabored ventilation and respiratory function stable Cardiovascular status: blood pressure returned to baseline and stable Postop Assessment: no headache, no backache, spinal receding and patient able to bend at knees Anesthetic complications: no   No complications documented.  Last Vitals:  Vitals:   12/13/20 0000 12/13/20 0022  BP: 92/76 (!) 110/56  Pulse: 68 70  Resp: 18 16  Temp:  37.1 C  SpO2:  100%    Last Pain:  Vitals:   12/13/20 0022  TempSrc: Oral  PainSc:    Pain Goal:                   Lannie Fields

## 2020-12-13 NOTE — Lactation Note (Signed)
This note was copied from a baby's chart. RN and LC at bedside with IPAD interpreter.  Mom states she does have  WIC in [place].  She signed the [facility] referral form.  LC reviewed again the need to supplement infant after a BF due to gestational age.  Mom was offered to breastfeed now or offer infant a bottle now.  Mom desired to formula feed.  She understands she can pump and offer her EBM to supplement after a BF or supplement with formula.     Maryruth Hancock [facility] 12/13/2020, 12:47 PM

## 2020-12-13 NOTE — Plan of Care (Signed)
  Problem: Education: Goal: Knowledge of General Education information will improve Description: Including pain rating scale, medication(s)/side effects and non-pharmacologic comfort measures Outcome: Progressing   Problem: Health Behavior/Discharge Planning: Goal: Ability to manage health-related needs will improve Outcome: Progressing   Problem: Clinical Measurements: Goal: Ability to maintain clinical measurements within normal limits will improve Outcome: Progressing Goal: Will remain free from infection Outcome: Progressing Goal: Diagnostic test results will improve Outcome: Progressing Goal: Respiratory complications will improve Outcome: Progressing Goal: Cardiovascular complication will be avoided Outcome: Progressing   Problem: Activity: Goal: Risk for activity intolerance will decrease Outcome: Progressing   Problem: Nutrition: Goal: Adequate nutrition will be maintained Outcome: Progressing   Problem: Coping: Goal: Level of anxiety will decrease Outcome: Progressing   Problem: Elimination: Goal: Will not experience complications related to bowel motility Outcome: Progressing Goal: Will not experience complications related to urinary retention Outcome: Progressing   Problem: Pain Managment: Goal: General experience of comfort will improve Outcome: Progressing   Problem: Safety: Goal: Ability to remain free from injury will improve Outcome: Progressing   Problem: Skin Integrity: Goal: Risk for impaired skin integrity will decrease Outcome: Progressing   Problem: Education: Goal: Knowledge of disease or condition will improve Outcome: Progressing Goal: Knowledge of the prescribed therapeutic regimen will improve Outcome: Progressing Goal: Individualized Educational Video(s) Outcome: Progressing   Problem: Clinical Measurements: Goal: Complications related to the disease process, condition or treatment will be avoided or minimized Outcome:  Progressing   Problem: Education: Goal: Knowledge of condition will improve Outcome: Progressing Goal: Individualized Educational Video(s) Outcome: Progressing Goal: Individualized Newborn Educational Video(s) Outcome: Progressing   Problem: Activity: Goal: Will verbalize the importance of balancing activity with adequate rest periods Outcome: Progressing Goal: Ability to tolerate increased activity will improve Outcome: Progressing   Problem: Coping: Goal: Ability to identify and utilize available resources and services will improve Outcome: Progressing   Problem: Life Cycle: Goal: Chance of risk for complications during the postpartum period will decrease Outcome: Progressing   Problem: Role Relationship: Goal: Ability to demonstrate positive interaction with newborn will improve Outcome: Progressing   Problem: Skin Integrity: Goal: Demonstration of wound healing without infection will improve Outcome: Progressing   

## 2020-12-13 NOTE — Lactation Note (Cosign Needed)
This note was copied from a baby's chart. Lactation Consultation Note  Patient Name: Nichole Soto [id number] Date: 12/13/2020 Reason for consult: Late-preterm 34-36.6wks;Multiple gestation;Initial assessment Age:30 hours  Maternal Data Has patient been taught Hand Expression?: Yes Does the patient have breastfeeding experience prior to this delivery?: Yes  Feeding Feeding Type: Breast Fed  LATCH Score Latch: Grasps breast easily, tongue down, lips flanged, rhythmical sucking.  Audible Swallowing: A few with stimulation  Type of Nipple: Everted at rest and after stimulation  Comfort (Breast/Nipple): Soft / non-tender  Hold (Positioning): Assistance needed to correctly position infant at breast and maintain latch.  LATCH Score: 8  Interventions Interventions: Breast feeding basics reviewed;Assisted with latch;Hand express;Breast massage;Breast compression;Support pillows;Expressed milk  Lactation Tools Discussed/Used WIC Program:  (Did not ask before mom fell asleep during consult; LC will follow up) Pump Education: Setup, frequency, and cleaning   Consult Status Consult Status: Follow-up Date: 12/13/20 Follow-up type: In-patient    Maryruth Hancock [facility] 12/13/2020, 12:43 PM

## 2020-12-13 NOTE — Lactation Note (Signed)
This note was copied from a baby's chart. Lactation Consultation Note  Patient Name: Nichole Soto [id number] Date: 12/13/2020 Reason for consult: Late-preterm 34-36.6wks;Multiple gestation;Initial assessment Age:30 hours  LC entered room. Birthing parent was holding infant STS.  Interpreter was asked to join consult.  LC began with congratulating birthing parent on baby and asked for name.  LC asked birthing parent if this was first baby and birthing parent stated no.  This was the 3rd baby; had a 39 year old at home.  Dad mentioned birthing parent had a premie before and loss the infant.  LC expressed condolences.  LC asked lactating parent if she noticed any changes in her breast during her pregnancy and lactating parent confirmed she did.   LC asked lactating parent if she had breastfed her 9 year. Lactating parent confirmed that she had for 1.5 years.  LC asked if she knew how to hand express and she stated that she did.  LC educated lactating parent on feeding cues and not going longer than 3 hours without waking infant to feed due to the infant being late preterm.  LC also educated lactating parent on STS.    LC noticed pump was setup in the room. LC asked lactating parent if she had pumped.  Lactating parent confirmed she had pumped and that she did not experience any pain associated with pumping and that her breast felt fine.   LC educated lactating parent on breastfeeding first and then pumping after the feed for additional stimulation and the expressed milk could be used for supplementation.  Supplementation guidelines reviewed with patient and provided with the LPI supplementation sheet.   LC asked if lactating parent would be fine with the LC hand expressing.  LC was able to easily hand express.  Lactating parent was beginning to fall asleep.  LC asked lactating parent if she had any additional questions. Lactating parent asked if she could feed infant and LC encouraged yes and  provided assistance.   LC assisted lactating parent with infant latching.  Infant easily latched to the breast and sucked and swallowed for 5 mins.  LC educated lactating parent about ensuring to not pull breast tissue away from infant mouth but to push down and to massage the breast while infant fed.  Infant ended feed on his own.   LC asked lactating parent if she had any additional questions.  Lactating parent declined.   Interpreter was left at bedside.  Both parents were falling asleep at end of consult.  Information about WIC was not provided at this time and will be followed up with at a later consult with an LC.   Maternal Data Has patient been taught Hand Expression?: Yes Does the patient have breastfeeding experience prior to this delivery?: Yes  Feeding Feeding Type: Breast Fed Nipple Type: Slow - flow  LATCH Score Latch: Grasps breast easily, tongue down, lips flanged, rhythmical sucking.  Audible Swallowing: A few with stimulation  Type of Nipple: Everted at rest and after stimulation  Comfort (Breast/Nipple): Soft / non-tender  Hold (Positioning): Assistance needed to correctly position infant at breast and maintain latch.  LATCH Score: 8  Interventions Interventions: Breast feeding basics reviewed;Assisted with latch;Hand express;Breast massage;Breast compression;Support pillows;Expressed milk  Lactation Tools Discussed/Used     Consult Status Consult Status: Follow-up Date: 12/13/20 Follow-up type: In-patient    Scarlette Ar 12/13/2020, 10:31 AM

## 2020-12-13 NOTE — Lactation Note (Deleted)
This note was copied from a baby's chart. Lactation Consultation Note  Patient Name: Nichole Soto [id number] Date: 12/13/2020 Reason for consult: Late-preterm 34-36.6wks;Multiple gestation;Initial assessment Age:30 hours  Maternal Data Has patient been taught Hand Expression?: Yes Does the patient have breastfeeding experience prior to this delivery?: Yes  Feeding Feeding Type: Bottle Fed - Formula (RN fed infant, educated MOB and FOB on importance of feeding on demand and no more than 3 hours in between feeds. RN educated on waking infant up if he is not cueing.) Nipple Type: Slow - flow  LATCH Score Latch: Grasps breast easily, tongue down, lips flanged, rhythmical sucking.  Audible Swallowing: A few with stimulation  Type of Nipple: Everted at rest and after stimulation  Comfort (Breast/Nipple): Soft / non-tender  Hold (Positioning): Assistance needed to correctly position infant at breast and maintain latch.  LATCH Score: 8  Interventions Interventions: Breast feeding basics reviewed;Assisted with latch;Hand express;Breast massage;Breast compression;Support pillows;Expressed milk  Lactation Tools Discussed/Used WIC Program:  (Did not ask before mom fell asleep during consult; LC will follow up) Pump Education: Setup, frequency, and cleaning   Consult Status Consult Status: Follow-up Date: 12/13/20 Follow-up type: In-patient    Maryruth Hancock [facility] 12/13/2020, 12:45 PM

## 2020-12-13 NOTE — Progress Notes (Signed)
CSW acknowledges consult and completed clinical assessment.  Clinical documentation will follow. CSW utilized [place] spanish iterpreter (Raquel).   There are no barriers to d/c.  MOB provided with car seat. MOB reported that FOB will bring $30 in. RN updated and agreed to collect money from FOB.   Camryn Quesinberry, LCSW Clinical Social Worker [facility] Cell#: [phone]  

## 2020-12-14 ENCOUNTER — Other Ambulatory Visit (HOSPITAL_COMMUNITY): Payer: Self-pay | Admitting: Obstetrics and Gynecology

## 2020-12-14 DIAGNOSIS — O99893 Other specified diseases and conditions complicating puerperium: Secondary | ICD-10-CM

## 2020-12-14 MED ORDER — OXYCODONE HCL 5 MG PO TABS: 5.0000 mg | ORAL_TABLET | ORAL | 0 refills | Status: DC | PRN

## 2020-12-14 MED ORDER — IBUPROFEN 600 MG PO TABS: 600.0000 mg | ORAL_TABLET | Freq: Four times a day (QID) | ORAL | 5 refills | Status: DC | PRN

## 2020-12-14 MED ORDER — IBUPROFEN 600 MG PO TABS: 600.0000 mg | ORAL_TABLET | Freq: Four times a day (QID) | ORAL | 3 refills | Status: DC | PRN

## 2020-12-14 NOTE — Clinical Social Work Maternal (Signed)
CLINICAL SOCIAL WORK MATERNAL/CHILD NOTE  Patient Details  Name: Nichole Soto MRN: [id number] Date of Birth: 02/10/1991  Date:  12-24-2020  Clinical Social Worker Initiating Note:  Abundio Miu, [place] Date/Time: Initiated:  12-10-20/1500     Child's Name:  Nichole Soto   Biological Parents:  Mother,Father (Father: Nichole Soto)   Need for Interpreter:  Spanish   Reason for Referral:  Other (Comment) (Questions about car seats)   Address:  [address] [place] [address]    Phone number:  [phone] (home)     Additional phone number:   Household Members/Support Persons (HM/SP):   Household Member/Support Person 1,Household Member/Support Person 2,Household Member/Support Person 3,Household Member/Support Person 4   HM/SP Name Relationship DOB or Age  HM/SP -1 Nichole Soto FOB    HM/SP -2 Nichole Soto 08/10/11  HM/SP -3   FOB's cousin    HM/SP -36   FOB's cousin    HM/SP -5        HM/SP -6        HM/SP -7        HM/SP -8          Natural Supports (not living in the home):      Professional Supports: None   Employment: Unemployed   Type of Work:     Education:  Other (comment) (2nd Grade)   Homebound arranged:    Museum/gallery curator Resources:  Self-Pay    Other Resources:      Cultural/Religious Considerations Which May Impact Care:    Strengths:  Ability to meet basic needs ,Home prepared for child    Psychotropic Medications:         Pediatrician:       Pediatrician List:   [place]      Pediatrician Fax Number:    Risk Factors/Current Problems:  Other(Comment)   Cognitive State:  Able to Concentrate ,Alert ,Goal Oriented ,Linear Thinking    Mood/Affect:  Calm ,Interested ,Comfortable ,Relaxed    CSW Assessment: RN voiced concerns about MOB not taking the initiative to care for infant.   CSW met with  MOB at bedside to address questions about car seats and RN's concerns about MOB caring for infant. CSW utilized [employer] (Raquel). CSW introduced self and explained reason for consult. MOB presented calm and remained engaged during assessment. MOB reported that she resides with FOB, FOB's 2 cousins and her Soto. MOB reported that she is not signed up for [facility]. CSW asked MOB if she wanted CSW to complete a referral for [facility], MOB reported yes. CSW agreed to complete referral. MOB reported that they have all items to care for infant except a car seat. MOB reported that FOB went to [facility] and was unable to find a car seat. CSW informed MOB about the hospital car seat program. MOB reported that she is interested and FOB will bring $30 later today. CSW informed MOB to provide money to RN and RN will provide to CSW. CSW agreed to deliver car seat to room. CSW inquired about MOB's support system, MOB reported that FOB is her support.   CSW inquired about MOB's mental health history. MOB reported no mental health history. MOB denied any postpartum depression history. CSW inquired about how MOB was feeling emotionally after giving birth, MOB reported that she was feeling good. MOB  presented calm and did not demonstrate any acute mental health signs/symptoms. CSW assessed for safety, MOB denied SI, HI and domestic violence.   CSW provided education regarding the baby blues period vs. perinatal mood disorders, discussed treatment and gave resources for mental health follow up if concerns arise.  CSW recommends self-evaluation during the postpartum time period using the New Mom Checklist from Postpartum Progress and encouraged MOB to contact a medical professional if symptoms are noted at any time.    CSW provided review of Sudden Infant Death Syndrome (SIDS) precautions. MOB verbalized understanding and reported that infant has a crib to sleep in.   CSW inquired about any barriers to [facility] caring for  infant. MOB reported that she has some pain from incision and pain medications have been given. MOB reported that the pain medications have been making her sleepy. MOB denied any fears associated with caring for infant and reported that she has a good understanding of how to care for infant. CSW encouraged MOB to participate in infant's care as much as possible while at the hospital. CSW emphasized the importance of MOB caring for infant, MOB agreed. CSW inquired about MOB's interest in parenting education support in the community, MOB reported that she is interested. CSW informed MOB about [facility] and healthy start, MOB agreeable to referral. CSW agreed to make referral. CSW inquired about any additional needs/concerns. MOB reported that she does not have insurance for herself or infant. CSW agreed to notify financial counselor and request that MOB be screened for Medicaid, MOB agreeable. MOB denied any additional needs/concerns.   CSW updated RN.   CSW delivered car seat to room.    CSW identifies no further need for intervention and no barriers to discharge at this time.  CSW made [place] and healthy start referral.   CSW notified financial counselor of MOB's needs, financial counselor agreed to screen MOB for medicaid.   CSW completed [facility] referral.   CSW Plan/Description:  Sudden Infant Death Syndrome (SIDS) Education,Perinatal Mood and Anxiety Disorder (PMADs) Education,No Further Intervention Required/No Barriers to Discharge,Other Information/Referral to [employer], LCSW 31-Oct-2021, 10:20 AM

## 2020-12-14 NOTE — Lactation Note (Signed)
This note was copied from a baby's chart. Lactation Consultation Note  Patient Name: Boy Nalia Honeycutt TRVUY'[id number] Date: 12/14/2020 Reason for consult: Follow-up assessment;Late-preterm 34-36.6wks Age:30 hours  Consult was done in Spanish:  Follow up to 42 hours old, LPTI of a P2 mother. SLP requests LC to follow with mother regarding some feeding questions that mother asked. Mother states she has an older child but child was raised by her mother in [place].   Reviewed LPTI policy with mother. Talked about feeding frequency and formula guidelines for LPTI. Reinforced the importance of skin to skin. Encouraged to contact [facility] for support and questions.    Maternal Data Formula Feeding for Exclusion: Yes Reason for exclusion: Mother's choice to formula and breast feed on admission  Feeding Feeding Type: Bottle Fed - Formula Nipple Type: Nfant Slow Flow (purple)  Interventions Interventions: Breast feeding basics reviewed;Skin to skin  Lactation Tools Discussed/Used WIC Program: Yes   Consult Status Consult Status: Follow-up Date: 12/15/20 Follow-up type: In-patient    Ourania Hamler A Higuera Ancidey 12/14/2020, 3:51 PM

## 2020-12-14 NOTE — Progress Notes (Signed)
CSW completed parents as teachers referral for additional parenting education support.   Kadeem Hyle, LCSW Clinical Social Worker [facility] Cell#: [phone]  

## 2020-12-14 NOTE — Progress Notes (Signed)
[facility] staff reached out to CSW and reported that MOB's case was recently closed due to reports of relocating out of state. CSW agreed to follow up with MOB.   CSW met with MOB at bedside to provide updates. CSW utilized spanish video interpreter (Viviana #[id number]). CSW inquired about how MOB was doing, MOB reported that she was doing fine. CSW provided update about referrals made to [facility], [facility] and [employer]. CSW asked MOB about previously participating with [facility] and services being closed. MOB reported that she didn't know the name of the program but that she only met with the caseworker twice. MOB reported that she was currently looking for a new house but plans to be in [place] [employer] term. Video interpreting session ended and CSW requested new interpreter. Spanish video interpreter (Stan #[id number]) was utilized. CSW asked if MOB was interested in restarting services. MOB reported that she was interested in restarting services with a new caseworker. CSW agreed to notify [facility] of MOB's request. CSW inquired about any additional needs/concerns. MOB reported none.  CSW updated [facility] of MOB's request to restart services.   Trev Boley, LCSW Clinical Social Worker [facility] Cell#: [phone]  

## 2020-12-14 NOTE — Plan of Care (Signed)
Patient to be discharged with printed instructions. Demaris Bousquet L Tyrese Capriotti, RN  

## 2020-12-14 NOTE — Discharge Instructions (Signed)
Parto por cesrea, cuidados posteriores Cesarean Delivery, Care After Esta hoja le brinda informacin sobre cmo cuidarse despus del procedimiento. El mdico tambin podr darle indicaciones ms especficas. Comunquese con el mdico si tiene problemas o preguntas. Qu puedo esperar despus del procedimiento? Despus del procedimiento, es comn tener los siguientes sntomas:  Una pequea cantidad de sangre o de lquido transparente que proviene de la incisin.  Enrojecimiento, hinchazn y dolor en la zona de la incisin.  Dolor y molestia abdominales.  Hemorragia vaginal (loquios). Aunque no haya tenido parto vaginal, tendr sangrado y secrecin vaginal.  Calambres plvicos.  Fatiga. Es posible que sienta dolor, hinchazn y molestias en el tejido que se encuentra entre la vagina y el ano (perineo) en los siguientes casos:  Si la cesrea no fue planificada y le permitieron realizar el trabajo de parto y pujar.  Si le hicieron una incisin en la zona (episiotoma) o el tejido se desgarr durante el intento de parto vaginal. Siga estas indicaciones en su casa: Cuidados de la incisin  Siga las indicaciones del mdico acerca del cuidado de la incisin. Asegrese de hacer lo siguiente: ? Lvese las manos con agua y jabn antes de cambiar las vendas (vendaje). Use desinfectante para manos si no dispone de agua y jabn. ? Si tiene un vendaje, cmbielo o quteselo siguiendo las indicaciones del mdico. ? No retire los puntos (suturas), las grapas cutneas, la goma para cerrar la piel o las tiras adhesivas. Es posible que estos cierres cutneos deban permanecer en la piel durante 2semanas o ms tiempo. Si los bordes de las tiras adhesivas empiezan a despegarse y enroscarse, puede recortar los que estn sueltos. No retire las tiras adhesivas por completo a menos que el mdico se lo indique.  Controle todos los das la zona de la incisin para detectar signos de infeccin. Est atento a los  siguientes signos: ? Aumento del enrojecimiento, la hinchazn o el dolor. ? Ms lquido o sangre. ? Calor. ? Pus o mal olor.  No tome baos de inmersin, no nade ni use un jacuzzi hasta que el mdico la autorice. Pregntele al mdico si puede ducharse.  Cuando tosa o estornude, abrace una almohada. Esto ayuda con el dolor y disminuye la posibilidad de que su incisin se abra (dehiscencia). Haga esto hasta que la incisin cicatrice.   Medicamentos  Tome los medicamentos de venta libre y los recetados solamente como se lo haya indicado el mdico.  Si le recetaron un antibitico, tmelo como se lo haya indicado el mdico. No deje de tomar el antibitico aunque comience a sentirse mejor.  No conduzca ni use maquinaria pesada mientras toma analgsicos recetados. Estilo de vida  No beba alcohol. Esto es de suma importancia si est amamantando o toma analgsicos.  No consuma ningn producto que contenga nicotina o tabaco, como cigarrillos, cigarrillos electrnicos y tabaco de mascar. Si necesita ayuda para dejar de fumar, consulte al mdico. Comida y bebida  Beba al menos 8vasos de ochoonzas (240cc) de agua todos los das a menos que el mdico le indique lo contrario. Si amamanta, quiz deba beber an ms cantidad de agua.  Coma alimentos ricos en fibras todos los das. Estos alimentos pueden ayudar a prevenir o aliviar el estreimiento. Los alimentos ricos en fibra incluyen los siguientes: ? Panes y cereales integrales. ? Arroz integral. ? Frijoles. ? Frutas y verduras frescas. Actividad  Si es posible, pdale a alguien que la ayude a cuidar del beb y con las tareas del hogar durante al   menos algunos das despus de que le den el alta del hospital.  Retome sus actividades normales como se lo haya indicado el mdico. Pregntele al mdico qu actividades son seguras para usted.  Descanse todo lo que pueda. Trate de descansar o tomar una siesta mientras el beb duerme.  No levante  ningn objeto que pese ms de 10libras (4,5kg) o el lmite de peso que le hayan indicado, hasta que el mdico le diga que puede hacerlo.  Hable con el mdico sobre cundo puede retomar la actividad sexual. Esto puede depender de lo siguiente: ? Riesgo de sufrir una infeccin. ? La rapidez con que cicatrice. ? Comodidad y deseo de retomar la actividad sexual.   Indicaciones generales  No use tampones ni se haga duchas vaginales hasta que el mdico la autorice.  Use ropa floja y cmoda y un sostn firme y que le calce bien.  Mantenga el perineo limpio y seco. Cuando vaya al bao, siempre higiencese de adelante hacia atrs.  Si expulsa un cogulo de sangre, gurdelo y llame al mdico para informrselo. No deseche los cogulos de sangre por el inodoro antes de recibir indicaciones del mdico.  Concurra a todas las visitas de seguimiento para usted y el beb, como se lo haya indicado el mdico. Esto es importante. Comunquese con un mdico si:  Tiene los siguientes sntomas: ? Fiebre. ? Secrecin vaginal con mal olor. ? Pus o mal olor en el lugar de la incisin. ? Dificultad o dolor al orinar. ? Aumento o disminucin repentinos de la frecuencia de las deposiciones. ? Aumento del enrojecimiento, la hinchazn o el dolor alrededor de la incisin. ? Aumento del lquido o sangre proveniente de la incisin. ? Erupcin cutnea. ? Nuseas. ? Poco inters o falta de inters en actividades que solan gustarle. ? Dudas sobre su cuidado y el del beb.  Su incisin se siente caliente al tacto.  Siente dolor en las mamas y se ponen rojas o duras.  Siente tristeza o preocupacin de forma inusual.  Vomita.  Elimina un cogulo de sangre grande por la vagina.  Orina ms de lo habitual.  Se siente mareado o aturdido. Solicite ayuda inmediatamente si:  Tiene los siguientes sntomas: ? Dolor que no desaparece o no mejora con medicamentos. ? Dolor en el pecho. ? Dificultad para  respirar. ? Visin borrosa o manchas en la visin. ? Pensamientos de autolesionarse o lesionar al beb. ? Nuevo dolor en el abdomen o en una de las piernas. ? Dolor de cabeza intenso.  Se desmaya.  Tiene una hemorragia tan intensa en la vagina que empapa ms de un apsito en una hora. El sangrado no debe ser ms abundante que el perodo ms intenso que haya tenido. Resumen  Despus del procedimiento, es comn tener dolor en el lugar de la incisin, clicos abdominales, y sangrado vaginal leve.  Controle todos los das la zona de la incisin para detectar signos de infeccin.  Informe al mdico sobre cualquier sntoma inusual.  Concurra a todas las visitas de seguimiento para usted y el beb, como se lo haya indicado el mdico. Esta informacin no tiene como fin reemplazar el consejo del mdico. Asegrese de hacerle al mdico cualquier pregunta que tenga. Document Revised: 06/13/2018 Document Reviewed: 06/13/2018 Elsevier Patient Education  2021 Elsevier Inc.  

## 2020-12-15 ENCOUNTER — Ambulatory Visit: Payer: Self-pay

## 2020-12-15 NOTE — Lactation Note (Signed)
This note was copied from a baby's chart. Lactation Consultation Note  Patient Name: Nichole Soto [id number] Date: 12/15/2020   Age:30 hours   P2 mother whose infant is now 21 hours old.  This is a LPTI born at 35+4 with a CGA of 36+0 weeks.  Mother has an older child who was raised by her mother in [place].    Ipad Spanish interpreter [phone]) used for interpretation.  Mother had baby latched to the breast when I arrived; baby was not sucking.  Mother had just bottle fed him.  Reviewed the LPTI and noted that the previous LC had also reviewed the policy.  Reminded mother to breast feed prior to formula feeding and to feed baby STS. (Baby was clothed and swaddled when mother was attempting to latch).  Questioned mother about pumping and she stated that she was told she did not need to pump and that the "baby would do it all."  Educated on the importance of pumping with the DEBP after every feeding due to the baby being a LPTI and the need for good breast stimulation.  Mother's breasts are very full and firm.  Offered to initiate the DEBP and mother agreeable.  Reviewed pump parts, assembly, disassembly and cleaning.  Changed mother from a #24 flange to a #27 flange for better fit.  Observed mother pumping and she was able to obtain 50 mls of EBM.  Praised her efforts.  Her breasts were much softer after the pumping and mother denied any discomfort.  Discussed feeding her EBM prior to giving formula and to keep the total feeding times to < 30 minutes.  Mother verbalized understanding.  WIC referral sent in 2 days ago.  Mother has been contacted by a [facility] representative.  Asked her to call the office when they open today and tell them she is being discharged.  She will need to pick up the DEBP after discharge.  Mother will follow through with this.  Praised mother for her efforts with pumping.  Reviewed feeding plan for after discharge and informed her that the pediatrician will talk to her  about the first return pediatric visit after discharge.  RN updated.   Maternal Data    Feeding    LATCH Score                   Interventions    Lactation Tools Discussed/Used     Consult Status Consult Status: Complete Date: 12/15/20 Follow-up type: Call as needed    [name] [name] 12/15/2020, 8:23 AM

## 2020-12-18 ENCOUNTER — Other Ambulatory Visit: Payer: Self-pay

## 2020-12-18 ENCOUNTER — Ambulatory Visit (INDEPENDENT_AMBULATORY_CARE_PROVIDER_SITE_OTHER): Payer: Self-pay | Admitting: *Deleted

## 2020-12-18 ENCOUNTER — Encounter: Payer: Self-pay | Admitting: Family Medicine

## 2020-12-18 VITALS — BP 113/68 | HR 93 | Ht <= 58 in | Wt 142.9 lb

## 2020-12-18 DIAGNOSIS — Z4889 Encounter for other specified surgical aftercare: Secondary | ICD-10-CM

## 2020-12-18 NOTE — Progress Notes (Signed)
Pt presents for incision check following C/S on 1/29.  Interpreter [employer] present for encounter. Honeycomb dressing and steristrips were removed. Incision was found to be healing well and without redness, swelling or drainage noted. An area of approximately 1.5 cm in length on the incision showed some dried blood. Dr. Adrian Blackwater in to examine incision. No active bleeding occurred with gently pressure. Pt was given proper wound cleansing instructions.  She stated that her baby is having difficulty with latching on for breastfeeding. She desires appointment with Lactation consultant. Pt will be added to schedule today and agrees to wait to be seen. Pt voiced understanding of all information and instructions given.

## 2020-12-21 NOTE — Progress Notes (Signed)
Chart reviewed - agree with CMA/RN documentation.  ° °

## 2020-12-28 ENCOUNTER — Ambulatory Visit: Payer: Self-pay

## 2020-12-28 ENCOUNTER — Ambulatory Visit: Payer: Self-pay | Attending: Obstetrics

## 2021-01-25 ENCOUNTER — Ambulatory Visit: Payer: Self-pay | Admitting: Obstetrics and Gynecology

## 2021-02-09 ENCOUNTER — Ambulatory Visit: Payer: Self-pay | Admitting: Advanced Practice Midwife

## 2021-02-23 ENCOUNTER — Ambulatory Visit: Payer: Self-pay | Admitting: Family Medicine

## 2021-08-01 IMAGING — US OBSTETRIC <14 WK ULTRASOUND
1 series · 15 of 28 positions shown · non-contrast
Comparison: June 06, 2019

CLINICAL DATA: Vaginal bleeding.

EXAM:
TWIN OBSTETRICAL ULTRASOUND <14 WKS

[Series 1: obstetric <14 wk ultrasound · 68 acquisitions, 15 frames shown]
[im 1/68]
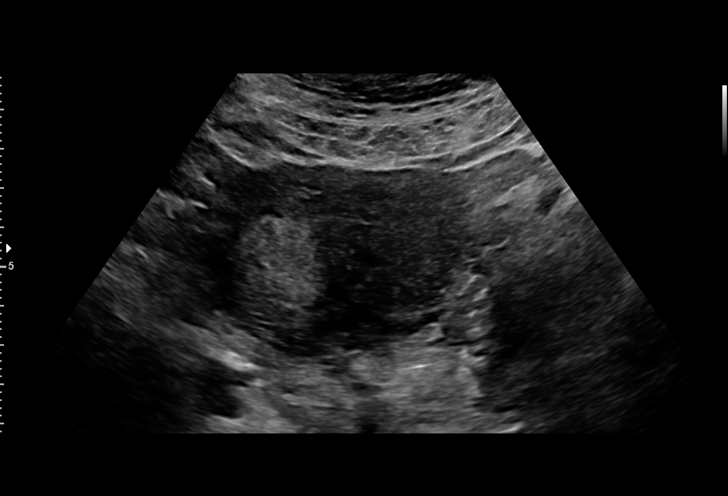
[im 5/68]
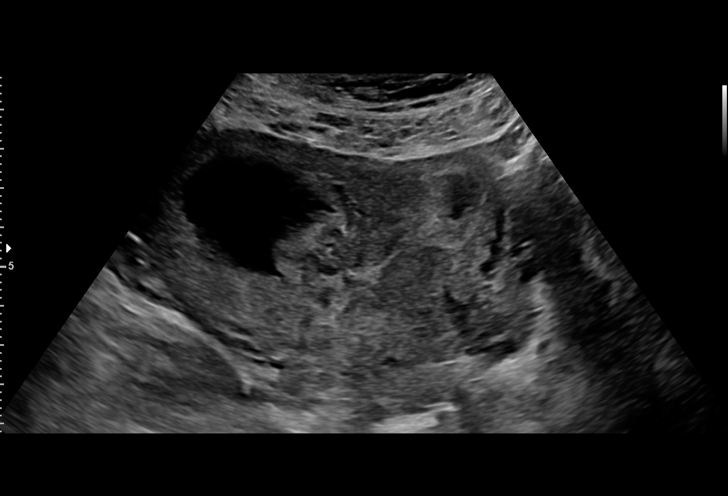
[im 10/68]
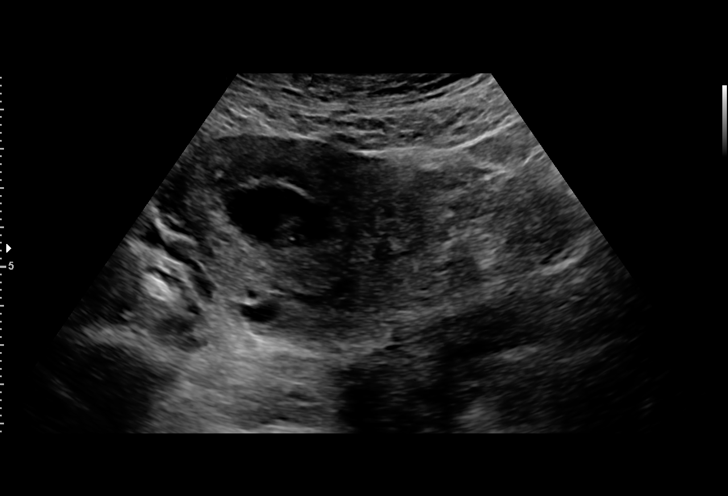
[im 15/68]
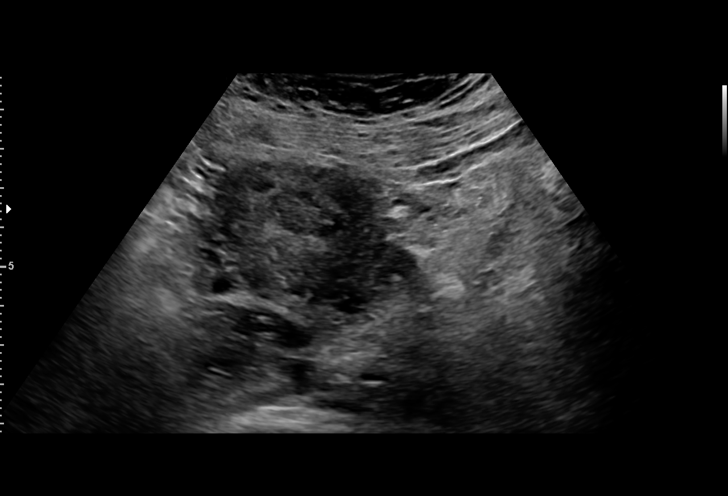
[im 20/68]
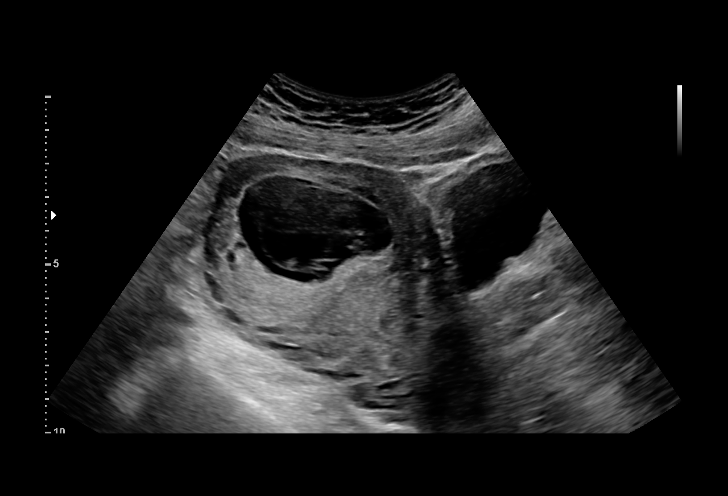
[im 25/68]
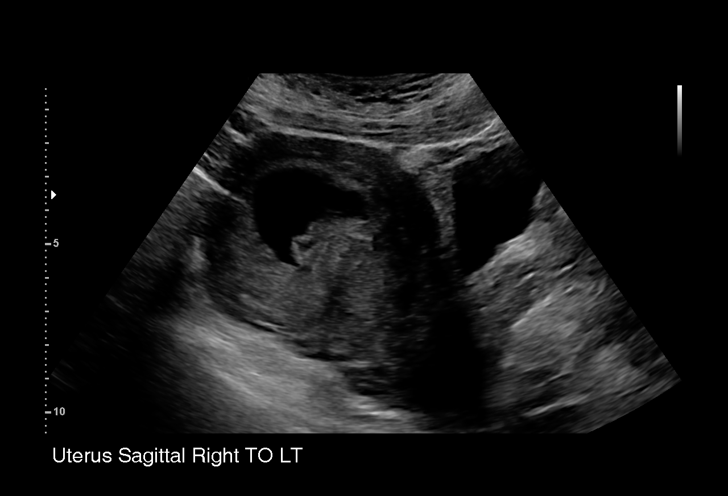
[im 30/68]
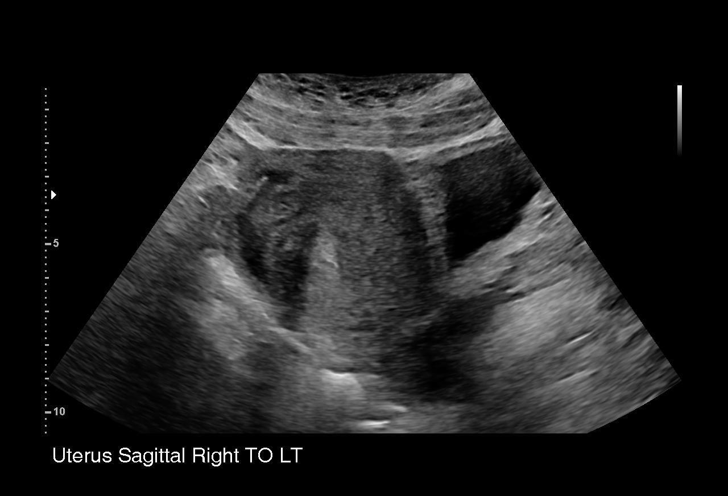
[im 35/68]
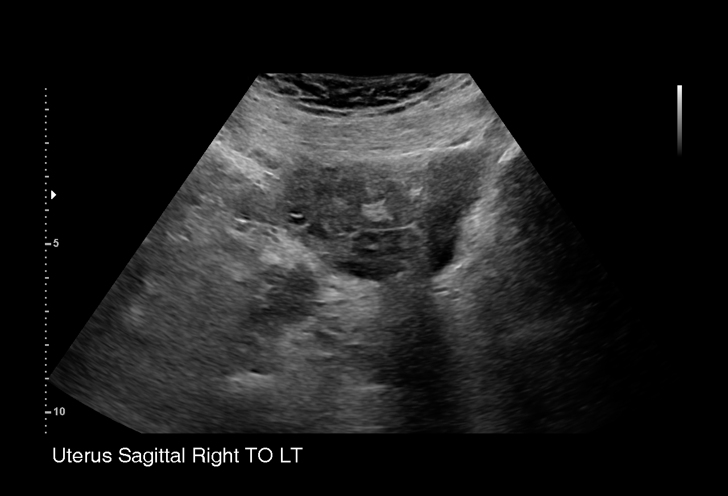
[im 38/68]
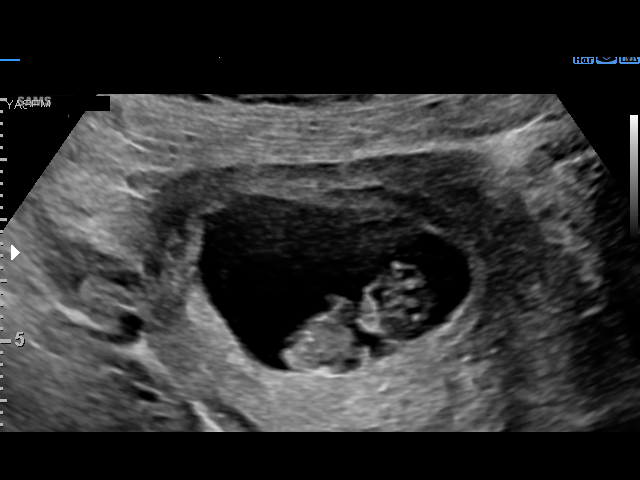
[im 43/68]
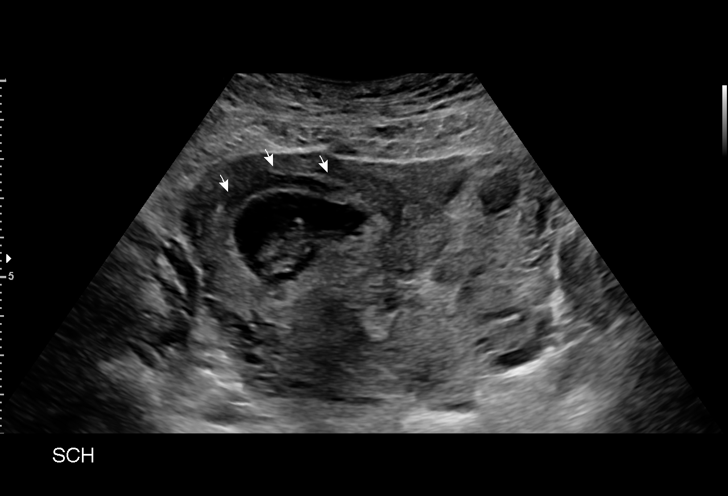
[im 48/68]
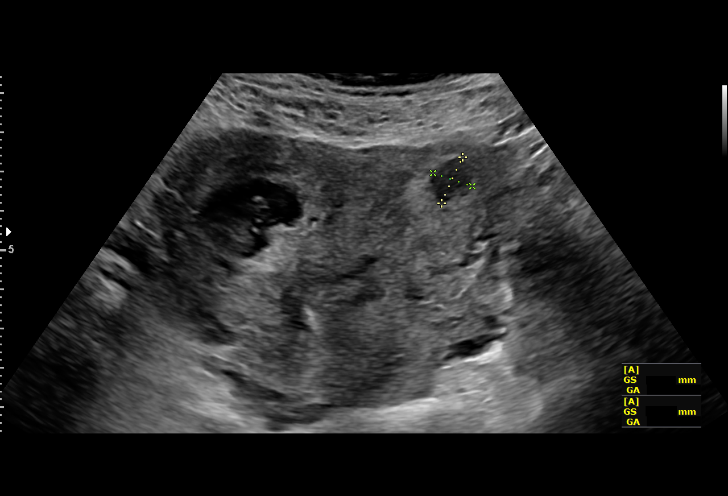
[im 53/68]
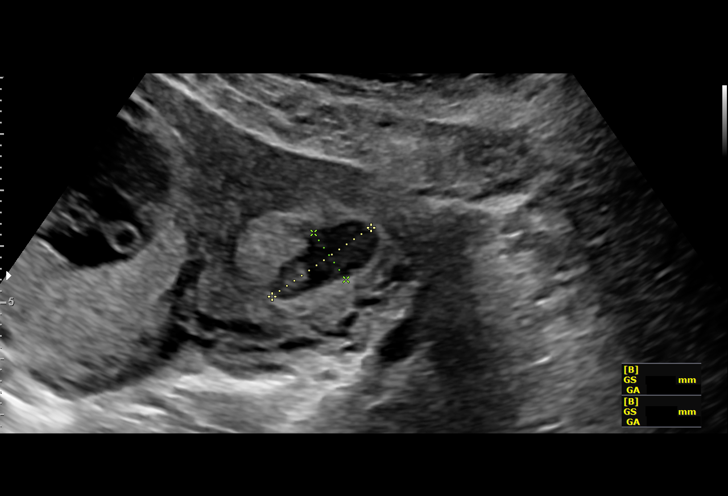
[im 58/68]
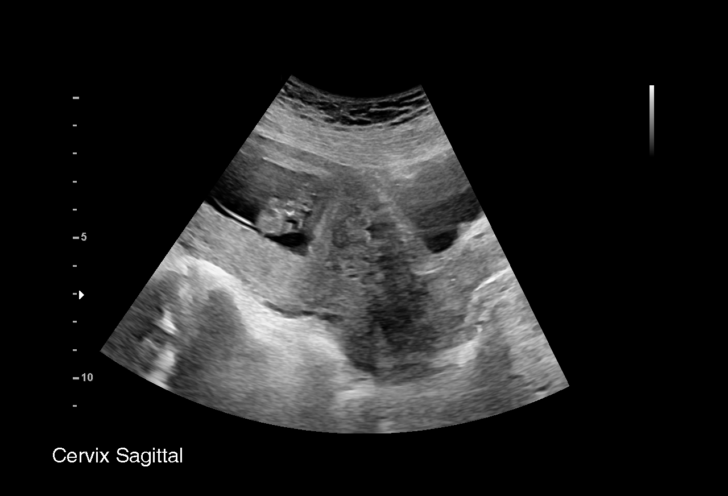
[im 63/68]
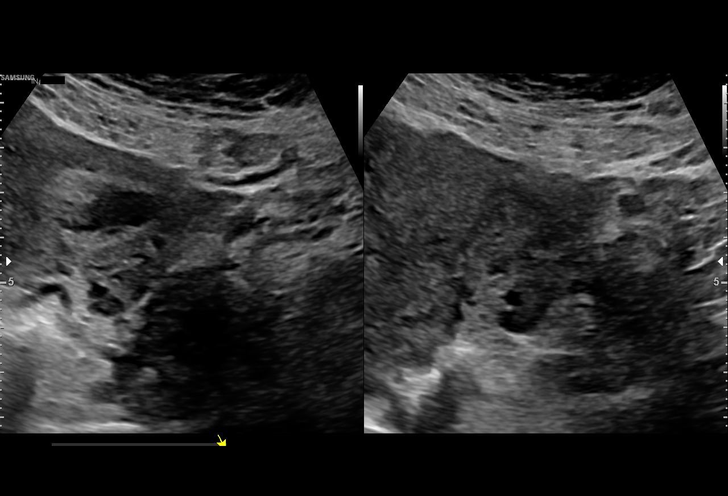
[im 68/68]
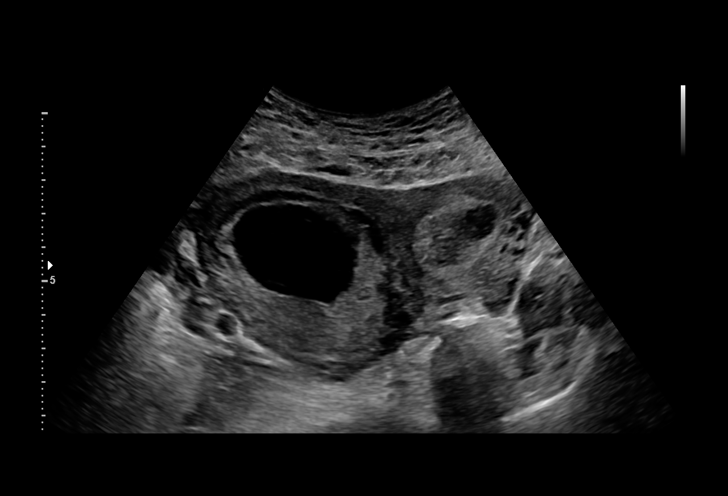

[15 of 28 positions shown; findings below may reference images not displayed]

FINDINGS: Number of IUPs:  2

Chorionicity/Amnionicity:  Dichorionic-diamniotic (thick membrane)

Septated versus bicornuate uterus. Twin 1 is located within the
right uterine horn. Twin 2 is located within the left uterine horn.

TWIN 1

Yolk sac:  Visualized.

Embryo:  Visualized.

Cardiac Activity: Visualized.

Heart Rate: 162 bpm

CRL: 28.1 mm   9 w 4 d                  US EDC: 01/22/2020

TWIN 2

Yolk sac:  Questionable

Embryo:  Visualized.

Cardiac Activity: Not Visualized.

CRL:   7.4 mm   6 w 4 d

Subchorionic hemorrhage: There is a small subchorionic hemorrhage
around the gestational sac of Twin 1.

Maternal uterus/adnexae: Normal appearance of the ovaries.

No free fluid seen.
IMPRESSION: Bicornuate versus septated uterus.

Live intrauterine gestation corresponding to 9 weeks and 4 days is
located within the right uterine horn. There is a small subchorionic
hemorrhage.

Nonviable intrauterine gestation with contracted gestational sac and
absent fetal cardiac activity is located within the left uterine
horn.

## 2021-11-01 ENCOUNTER — Ambulatory Visit (INDEPENDENT_AMBULATORY_CARE_PROVIDER_SITE_OTHER): Payer: Self-pay | Admitting: *Deleted

## 2021-11-01 DIAGNOSIS — Z3687 Encounter for antenatal screening for uncertain dates: Secondary | ICD-10-CM

## 2021-11-01 DIAGNOSIS — Z3201 Encounter for pregnancy test, result positive: Secondary | ICD-10-CM

## 2021-11-01 LAB — POCT PREGNANCY, URINE: Preg Test, Ur: POSITIVE — AB

## 2021-11-01 NOTE — Progress Notes (Signed)
Patient dropped off a urine sample today for a pregnancy test which was positive. I called Nichole Soto with interpreter and informed her pregnancy test was positive. She states she is not sure when her period was but may have been 10/05/21 but may have been a few weeks before that. I explained we need to order a dating [place] to be sure of EDD. [place] scheduled for tomorrow. She voices understanding. Garland Smouse,RN

## 2021-11-01 NOTE — Patient Instructions (Signed)
Prenatal Care Providers           [facility]  [address] [phone]  [facility]   [address]  [phone]  [facility] @ [facility]       [address] [phone]            [facility] @ [facility]     [address] [phone]          [facility] @ [place]   [address] [phone]  [facility]  [address] [phone]     [facility])  [address]   [phone]     [facility]  Phone: [phone]  [facility]  Phone: [phone]  [facility] Phone: [phone]  [facility] Phone: [phone]  [facility] Phone: [phone]  [facility] Phone: [phone]  [facility]  Phone: [phone]  

## 2021-11-02 ENCOUNTER — Other Ambulatory Visit: Payer: Self-pay

## 2021-11-02 ENCOUNTER — Ambulatory Visit
Admission: RE | Admit: 2021-11-02 | Discharge: 2021-11-02 | Disposition: A | Discharge status: Home / Self Care | Payer: Self-pay | Source: Ambulatory Visit | Attending: Obstetrics and Gynecology | Admitting: Obstetrics and Gynecology

## 2021-11-02 ENCOUNTER — Ambulatory Visit (INDEPENDENT_AMBULATORY_CARE_PROVIDER_SITE_OTHER): Payer: Self-pay | Admitting: Family Medicine

## 2021-11-02 VITALS — BP 105/67 | HR 79 | Wt 141.8 lb

## 2021-11-02 DIAGNOSIS — O020 Blighted ovum and nonhydatidiform mole: Secondary | ICD-10-CM

## 2021-11-02 DIAGNOSIS — Z8759 Personal history of other complications of pregnancy, childbirth and the puerperium: Secondary | ICD-10-CM

## 2021-11-02 DIAGNOSIS — Z3687 Encounter for antenatal screening for uncertain dates: Secondary | ICD-10-CM | POA: Insufficient documentation

## 2021-11-02 HISTORY — DX: Personal history of other complications of pregnancy, childbirth and the puerperium: Z87.59

## 2021-11-02 NOTE — Assessment & Plan Note (Signed)
Patient understandably surprised by news. We discussed etiology of complete/partial moles and that this is not a viable pregnancy. We discussed need for intervention with D&C and pathology to examine exactly what type of mole is present. We also discussed need for frequent monitoring of hcg levels until they return to normal, small but serious possibility of malignancy, and need to avoid pregnancy until hcg levels are normal again. She is unsure about contraception at present but will think about it.  Message sent to schedule D&C for next available. HCG obtained at this visit for baseline.  Blood type O+.

## 2021-11-02 NOTE — Progress Notes (Signed)
GYNECOLOGY OFFICE VISIT NOTE  History:   Nichole Soto is a 30 y.o. [phone] here today for ultrasound results.  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   Health Maintenance:   Last pap: Lab Results  Component Value Date   DIAGPAP  08/08/2019    - Negative for intraepithelial lesion or malignancy (NILM)     Last mammogram:  N/a    Review of Systems:  Pertinent items noted in HPI and remainder of comprehensive ROS otherwise negative.  Physical Exam:  BP 105/67    Pulse 79    Wt 141 lb 12.8 oz (64.3 kg)    BMI 30.69 kg/m  CONSTITUTIONAL: Well-developed, well-nourished female in no acute distress.  HEENT:  Normocephalic, atraumatic. External right and left ear normal. No scleral icterus.  NECK: Normal range of motion, supple, no masses noted on observation SKIN: No rash noted. Not diaphoretic. No erythema. No pallor. MUSCULOSKELETAL: Normal range of motion. No edema noted. NEUROLOGIC: Alert and oriented to  person, place, and time. Normal muscle tone coordination.  PSYCHIATRIC: Normal mood and affect. Normal behavior. Normal judgment and thought content. RESPIRATORY: Effort normal, no problems with respiration noted   Labs and Imaging Results for orders placed or performed in visit on 11/01/21 (from the past 168 hour(s))  Pregnancy, urine POC   Collection Time: 11/01/21  1:54 PM  Result Value Ref Range   Preg Test, Ur POSITIVE (A) NEGATIVE   US OB LESS THAN 14 WEEKS WITH OB TRANSVAGINAL  Result Date: 11/02/2021 CLINICAL DATA:  First trimester pregnancy, uncertain LMP in dating EXAM: OBSTETRIC <14 WK [place] AND TRANSVAGINAL OB US TECHNIQUE: Both transabdominal and transvaginal ultrasound examinations were performed for complete evaluation of the gestation as well as the maternal uterus, adnexal regions, and pelvic cul-de-sac. Transvaginal technique was performed to assess early pregnancy. COMPARISON:  None FINDINGS: Intrauterine gestational sac: Absent; small amount of fluid within endometrial canal early in the exam resolves by the completion of the study likely represent endometrial fluid at the lower uterine segment Yolk sac:  N/A Embryo:  N/A Cardiac Activity: N/A Heart Rate: N/A  bpm MSD:   mm    w     d CRL:    mm    w    d                  [place] EDC: Subchorionic hemorrhage:  N/A Maternal uterus/adnexae: Uterus anteverted, with anterior wall Caesarean section scar noted. Large  heterogeneous mass identified centrally within the upper uterus, 7.5 x 6.3 x 7.0 cm, slightly increased echogenicity with multiple small cystic foci, most consistent with a molar pregnancy. No additional uterine mass. LEFT ovary normal size and morphology 3.5 x 1.8 x 2.3 cm. RIGHT ovary normal size and morphology 3.3 x 1.5 x 2.0 cm. No free pelvic fluid or adnexal masses. IMPRESSION: No intrauterine gestational sac identified. Large heterogeneous generally hyperechoic mass within the upper central uterus containing cystic foci,  lesion overall 7.5 x 6.3 x 7.0 cm, most consistent with a molar pregnancy. These results will be called to the ordering clinician or representative by the Radiologist Assistant, and communication documented in the PACS or [employer]. Electronically Signed   By: Ulyses Southward M.D.   On: 11/02/2021 14:23      Assessment and Plan:   Problem List Items Addressed This Visit       Other   Molar pregnancy - Primary (Chronic)    Patient understandably surprised by news. We discussed etiology of complete/partial moles and that this is not a viable pregnancy. We discussed need for intervention with D&C and pathology to examine exactly what type of mole is present. We also discussed need for frequent monitoring of hcg levels until they return to normal, small but serious possibility of malignancy, and need to avoid pregnancy until hcg levels are normal again. She is unsure about contraception at present but will think about it.  Message sent to schedule D&C for next available. HCG obtained at this visit for baseline.  Blood type O+.       Relevant Orders   Beta hCG quant (ref lab)    Routine preventative health maintenance measures emphasized. Please refer to After Visit Summary for other counseling recommendations.   Return for pending timing of D&C.    Total face-to-face time with patient: 25 minutes.  Over 50% of encounter was spent on counseling and coordination of care.   Venora Maples, MD/MPH Attending Family Medicine Physician, [facility] for [facility], [facility]

## 2021-11-03 ENCOUNTER — Other Ambulatory Visit (HOSPITAL_COMMUNITY): Payer: Self-pay | Admitting: Obstetrics and Gynecology

## 2021-11-03 ENCOUNTER — Other Ambulatory Visit: Payer: Self-pay | Admitting: Obstetrics and Gynecology

## 2021-11-03 ENCOUNTER — Encounter (HOSPITAL_COMMUNITY): Payer: Self-pay | Admitting: Obstetrics and Gynecology

## 2021-11-03 ENCOUNTER — Telehealth: Payer: Self-pay

## 2021-11-03 ENCOUNTER — Other Ambulatory Visit: Payer: Self-pay

## 2021-11-03 DIAGNOSIS — O020 Blighted ovum and nonhydatidiform mole: Secondary | ICD-10-CM

## 2021-11-03 LAB — BETA HCG QUANT (REF LAB): hCG Quant: 243341 m[IU]/mL

## 2021-11-03 NOTE — Progress Notes (Signed)
PCP - Denies  Cardiologist - Denies  EP- Denies  Endocrine- Denies  Pulm- Denies  Chest x-ray - Denies  EKG - Denies  Stress Test - Denies  ECHO - Denies  Cardiac Cath - Denies  AICD- na PM- na LOOP- na  Dialysis- Denies  Sleep Study - Denies CPAP - Denies  LABS- CBC  ASA- Denies  ERAS- No  HA1C- Denies  Anesthesia- No  Voice Interpreter [place] # [phone] used to perform this interview, and the pt denies having chest pain, sob, or fever during the pre-op phone call. All instructions explained to the pt, with a verbal understanding of the material including: as of today, stop taking all Aspirin (unless instructed by your doctor) and Other Aspirin containing products, Vitamins, Fish oils, and Herbal medications. Also stop all NSAIDS i.e. Advil, Ibuprofen, Motrin, Aleve, Anaprox, Naproxen, BC, Goody Powders, and all Supplements. Pt also instructed to wear a mask and social distance if she goes out. The opportunity to ask questions was provided.    Coronavirus Screening  Have you experienced the following symptoms:  Cough yes/no: No Fever (>100.55F)  yes/no: No Runny nose yes/no: No Sore throat yes/no: No Difficulty breathing/shortness of breath  yes/no: No  Have you or a family member traveled in the last 14 days and where? yes/no: No   If the patient indicates "YES" to the above questions, their PAT will be rescheduled to limit the exposure to others and, the surgeon will be notified. THE PATIENT WILL NEED TO BE ASYMPTOMATIC FOR 14 DAYS.   If the patient is not experiencing any of these symptoms, the PAT nurse will instruct them to NOT bring anyone with them to their appointment since they may have these symptoms or traveled as well.   Please remind your patients and families that hospital visitation restrictions are in effect and the importance of the restrictions.

## 2021-11-03 NOTE — Telephone Encounter (Signed)
Called patient w/ pac[place] interpreters ID [phone], surgery date, time and preop instructions given.

## 2021-11-04 ENCOUNTER — Ambulatory Visit (HOSPITAL_COMMUNITY): Payer: Self-pay | Admitting: Anesthesiology

## 2021-11-04 ENCOUNTER — Ambulatory Visit (HOSPITAL_COMMUNITY)
Admission: RE | Admit: 2021-11-04 | Discharge: 2021-11-04 | Disposition: A | Discharge status: Home / Self Care | Payer: Self-pay | Attending: Obstetrics and Gynecology | Admitting: Obstetrics and Gynecology

## 2021-11-04 ENCOUNTER — Encounter (HOSPITAL_COMMUNITY): Payer: Self-pay | Admitting: Obstetrics and Gynecology

## 2021-11-04 ENCOUNTER — Encounter (HOSPITAL_COMMUNITY)
Admission: RE | Disposition: A | Discharge status: Home / Self Care | Payer: Self-pay | Source: Home / Self Care | Attending: Obstetrics and Gynecology

## 2021-11-04 ENCOUNTER — Ambulatory Visit (HOSPITAL_COMMUNITY)
Admission: RE | Admit: 2021-11-04 | Discharge: 2021-11-04 | Disposition: A | Discharge status: Home / Self Care | Payer: Self-pay | Source: Ambulatory Visit | Attending: Obstetrics and Gynecology | Admitting: Obstetrics and Gynecology

## 2021-11-04 DIAGNOSIS — D649 Anemia, unspecified: Secondary | ICD-10-CM | POA: Insufficient documentation

## 2021-11-04 DIAGNOSIS — R519 Headache, unspecified: Secondary | ICD-10-CM | POA: Insufficient documentation

## 2021-11-04 DIAGNOSIS — D759 Disease of blood and blood-forming organs, unspecified: Secondary | ICD-10-CM | POA: Insufficient documentation

## 2021-11-04 DIAGNOSIS — O020 Blighted ovum and nonhydatidiform mole: Secondary | ICD-10-CM | POA: Insufficient documentation

## 2021-11-04 HISTORY — PX: DILATION AND EVACUATION: SHX1459

## 2021-11-04 HISTORY — PX: OPERATIVE ULTRASOUND: SHX5996

## 2021-11-04 LAB — TYPE AND SCREEN
ABO/RH(D): O POS
Antibody Screen: NEGATIVE

## 2021-11-04 LAB — CBC
HCT: 37.1 % (ref 36.0–46.0)
Hemoglobin: 12.2 g/dL (ref 12.0–15.0)
MCH: 28 pg (ref 26.0–34.0)
MCHC: 32.9 g/dL (ref 30.0–36.0)
MCV: 85.3 fL (ref 80.0–100.0)
Platelets: 272 10*3/uL (ref 150–400)
RBC: 4.35 MIL/uL (ref 3.87–5.11)
RDW: 13.8 % (ref 11.5–15.5)
WBC: 7.5 10*3/uL (ref 4.0–10.5)
nRBC: 0 % (ref 0.0–0.2)

## 2021-11-04 SURGERY — DILATION AND EVACUATION, UTERUS
Anesthesia: General

## 2021-11-04 MED ORDER — MISOPROSTOL 200 MCG PO TABS
ORAL_TABLET | ORAL | Status: AC
  Filled 2021-11-04: qty 5

## 2021-11-04 MED ORDER — KETOROLAC TROMETHAMINE 30 MG/ML IJ SOLN
INTRAMUSCULAR | Status: DC | PRN
  Administered 2021-11-04: 30 mg via INTRAVENOUS

## 2021-11-04 MED ORDER — KETOROLAC TROMETHAMINE 30 MG/ML IJ SOLN
INTRAMUSCULAR | Status: AC
  Filled 2021-11-04: qty 1

## 2021-11-04 MED ORDER — CHLOROPROCAINE HCL 1 % IJ SOLN
INTRAMUSCULAR | Status: AC
  Filled 2021-11-04: qty 30

## 2021-11-04 MED ORDER — CHLOROPROCAINE HCL 1 % IJ SOLN
INTRAMUSCULAR | Status: DC | PRN
  Administered 2021-11-04: 9 mL

## 2021-11-04 MED ORDER — OXYCODONE-ACETAMINOPHEN 5-325 MG PO TABS: 1.0000 | ORAL_TABLET | Freq: Four times a day (QID) | ORAL | 0 refills | Status: DC | PRN

## 2021-11-04 MED ORDER — MIDAZOLAM HCL 2 MG/2ML IJ SOLN
INTRAMUSCULAR | Status: AC
  Filled 2021-11-04: qty 2

## 2021-11-04 MED ORDER — SCOPOLAMINE 1 MG/3DAYS TD PT72
1.0000 | MEDICATED_PATCH | TRANSDERMAL | Status: DC
  Administered 2021-11-04: 13:00:00 1.5 mg via TRANSDERMAL

## 2021-11-04 MED ORDER — PROPOFOL 10 MG/ML IV BOLUS
INTRAVENOUS | Status: DC | PRN
  Administered 2021-11-04: 150 mg via INTRAVENOUS

## 2021-11-04 MED ORDER — FENTANYL CITRATE (PF) 250 MCG/5ML IJ SOLN
INTRAMUSCULAR | Status: AC
  Filled 2021-11-04: qty 5

## 2021-11-04 MED ORDER — LIDOCAINE 2% (20 MG/ML) 5 ML SYRINGE
INTRAMUSCULAR | Status: DC | PRN
  Administered 2021-11-04: 60 mg via INTRAVENOUS

## 2021-11-04 MED ORDER — LACTATED RINGERS IV SOLN: INTRAVENOUS | Status: DC

## 2021-11-04 MED ORDER — POVIDONE-IODINE 10 % EX SWAB: 2.0000 "application " | Freq: Once | CUTANEOUS | Status: DC

## 2021-11-04 MED ORDER — DIPHENHYDRAMINE HCL 50 MG/ML IJ SOLN
INTRAMUSCULAR | Status: DC | PRN
  Administered 2021-11-04: 6.25 mg via INTRAVENOUS

## 2021-11-04 MED ORDER — DEXAMETHASONE SODIUM PHOSPHATE 10 MG/ML IJ SOLN
INTRAMUSCULAR | Status: DC | PRN
  Administered 2021-11-04: 10 mg via INTRAVENOUS

## 2021-11-04 MED ORDER — MIDAZOLAM HCL 2 MG/2ML IJ SOLN
INTRAMUSCULAR | Status: DC | PRN
  Administered 2021-11-04: 2 mg via INTRAVENOUS

## 2021-11-04 MED ORDER — IBUPROFEN 600 MG PO TABS: 600.0000 mg | ORAL_TABLET | Freq: Four times a day (QID) | ORAL | 3 refills | Status: DC | PRN

## 2021-11-04 MED ORDER — CHLORHEXIDINE GLUCONATE 0.12 % MT SOLN
15.0000 mL | OROMUCOSAL | Status: AC
  Administered 2021-11-04: 11:00:00 15 mL via OROMUCOSAL
  Filled 2021-11-04 (×2): qty 15

## 2021-11-04 MED ORDER — MEPERIDINE HCL 25 MG/ML IJ SOLN: 6.2500 mg | INTRAMUSCULAR | Status: DC | PRN

## 2021-11-04 MED ORDER — MISOPROSTOL 100 MCG PO TABS
ORAL_TABLET | ORAL | Status: DC | PRN
  Administered 2021-11-04: 1000 ug via RECTAL

## 2021-11-04 MED ORDER — FENTANYL CITRATE (PF) 250 MCG/5ML IJ SOLN
INTRAMUSCULAR | Status: DC | PRN
  Administered 2021-11-04: 25 ug via INTRAVENOUS

## 2021-11-04 MED ORDER — SCOPOLAMINE 1 MG/3DAYS TD PT72
MEDICATED_PATCH | TRANSDERMAL | Status: AC
  Filled 2021-11-04: qty 1

## 2021-11-04 MED ORDER — ACETAMINOPHEN 500 MG PO TABS
1000.0000 mg | ORAL_TABLET | Freq: Once | ORAL | Status: AC
  Administered 2021-11-04: 13:00:00 1000 mg via ORAL

## 2021-11-04 MED ORDER — ACETAMINOPHEN 500 MG PO TABS
ORAL_TABLET | ORAL | Status: AC
  Filled 2021-11-04: qty 2

## 2021-11-04 MED ORDER — FENTANYL CITRATE (PF) 100 MCG/2ML IJ SOLN: 25.0000 ug | INTRAMUSCULAR | Status: DC | PRN

## 2021-11-04 MED ORDER — DIPHENHYDRAMINE HCL 50 MG/ML IJ SOLN
INTRAMUSCULAR | Status: AC
  Filled 2021-11-04: qty 1

## 2021-11-04 MED ORDER — PHENYLEPHRINE 40 MCG/ML (10ML) SYRINGE FOR IV PUSH (FOR BLOOD PRESSURE SUPPORT)
PREFILLED_SYRINGE | INTRAVENOUS | Status: DC | PRN
  Administered 2021-11-04 (×2): 80 ug via INTRAVENOUS

## 2021-11-04 MED ORDER — PROMETHAZINE HCL 25 MG/ML IJ SOLN: 6.2500 mg | INTRAMUSCULAR | Status: DC | PRN

## 2021-11-04 MED ORDER — OXYCODONE HCL 5 MG/5ML PO SOLN: 5.0000 mg | Freq: Once | ORAL | Status: DC | PRN

## 2021-11-04 MED ORDER — DOXYCYCLINE HYCLATE 100 MG IV SOLR
200.0000 mg | INTRAVENOUS | Status: AC
  Administered 2021-11-04: 13:00:00 200 mg via INTRAVENOUS
  Filled 2021-11-04: qty 200

## 2021-11-04 MED ORDER — ONDANSETRON HCL 4 MG/2ML IJ SOLN
INTRAMUSCULAR | Status: DC | PRN
  Administered 2021-11-04: 4 mg via INTRAVENOUS

## 2021-11-04 MED ORDER — OXYCODONE HCL 5 MG PO TABS: 5.0000 mg | ORAL_TABLET | Freq: Once | ORAL | Status: DC | PRN

## 2021-11-04 MED ORDER — PROPOFOL 10 MG/ML IV BOLUS
INTRAVENOUS | Status: AC
  Filled 2021-11-04: qty 20

## 2021-11-04 SURGICAL SUPPLY — 23 items
CATH ROBINSON RED A/P 16FR (CATHETERS) ×5 IMPLANT
DECANTER SPIKE VIAL GLASS SM (MISCELLANEOUS) ×3 IMPLANT
FILTER UTR ASPR ASSEMBLY (MISCELLANEOUS) ×3 IMPLANT
GLOVE SURG POLYISO LF SZ6.5 (GLOVE) ×3 IMPLANT
GLOVE SURG UNDER POLY LF SZ6.5 (GLOVE) ×3 IMPLANT
GLOVE SURG UNDER POLY LF SZ7 (GLOVE) ×5 IMPLANT
GOWN STRL REUS W/ TWL LRG LVL3 (GOWN DISPOSABLE) ×2 IMPLANT
GOWN STRL REUS W/TWL LRG LVL3 (GOWN DISPOSABLE) ×4
HOSE CONNECTING 18IN BERKELEY (TUBING) ×3 IMPLANT
KIT BERKELEY 1ST TRI 3/8 NO TR (MISCELLANEOUS) ×3 IMPLANT
KIT BERKELEY 1ST TRIMESTER 3/8 (MISCELLANEOUS) ×3 IMPLANT
NS IRRIG 1000ML POUR BTL (IV SOLUTION) ×3 IMPLANT
PACK VAGINAL MINOR WOMEN LF (CUSTOM PROCEDURE TRAY) ×3 IMPLANT
PAD OB MATERNITY 4.3X12.25 (PERSONAL CARE ITEMS) ×3 IMPLANT
SET BERKELEY SUCTION TUBING (SUCTIONS) ×5 IMPLANT
TOWEL GREEN STERILE FF (TOWEL DISPOSABLE) ×6 IMPLANT
UNDERPAD 30X36 HEAVY ABSORB (UNDERPADS AND DIAPERS) ×3 IMPLANT
VACURETTE 10 RIGID CVD (CANNULA) IMPLANT
VACURETTE 12 RIGID CVD (CANNULA) ×2 IMPLANT
VACURETTE 6 ASPIR F TIP BERK (CANNULA) IMPLANT
VACURETTE 7MM CVD STRL WRAP (CANNULA) IMPLANT
VACURETTE 8 RIGID CVD (CANNULA) IMPLANT
VACURETTE 9 RIGID CVD (CANNULA) IMPLANT

## 2021-11-04 NOTE — Op Note (Addendum)
Nichole Soto PROCEDURE DATE: 11/04/2021  PREOPERATIVE DIAGNOSIS: suspected molar pregnancy. POSTOPERATIVE DIAGNOSIS: The same. PROCEDURE:     Dilation and Evacuation. SURGEON:  Dr. Catalina Antigua  INDICATIONS: 30 y.o. [id number] with radiologic findings suspicious for a molar pregnancy, needing surgical completion.  Risks of surgery were discussed with the patient including but not limited to: bleeding which may require transfusion; infection which may require antibiotics; injury to uterus or surrounding organs;need for additional procedures including laparotomy or laparoscopy; possibility of intrauterine scarring which may impair future fertility; and other postoperative/anesthesia complications. Written informed consent was obtained.    FINDINGS:  A 12-week size anteverted uterus, moderate amounts of products of conception, specimen sent to pathology.  ANESTHESIA:    Monitored intravenous sedation, paracervical block. INTRAVENOUS FLUIDS:  800 ml of LR ESTIMATED BLOOD LOSS:  300 ml. SPECIMENS:  Products of conception sent to pathology COMPLICATIONS:  None immediate.  PROCEDURE DETAILS:  The patient received intravenous antibiotics while in the preoperative area.  She was then taken to the operating room where general anesthesia was administered and was found to be adequate.  After an adequate timeout was performed, she was placed in the dorsal lithotomy position and examined; then prepped and draped in the sterile manner.   Her bladder was catheterized for an unmeasured amount of clear, yellow urine. A vaginal speculum was then placed in the patient's vagina and a single tooth tenaculum was applied to the anterior lip of the cervix.  A paracervical block using 0.5% Marcaine was administered. The cervix was gently dilated to accommodate a 12 mm suction curette that was gently advanced to the uterine fundus.  The suction device was then activated and curette slowly rotated to clear  the uterus of products of conception.  A sharp curettage was then performed to confirm complete emptying of the uterus.  The procedure was performed under ultrasound guidance which also confirmed an empty uterus. There was minimal bleeding noted and the tenaculum removed with good hemostasis noted.   Patient also received 1000 mcg cytotec per rectum. All instruments were removed from the patient's vagina. The patient tolerated the procedure well and was taken to the recovery area awake, and in stable condition.  The patient will be discharged to home as per PACU criteria.  Routine postoperative instructions given.  She was prescribed Percocet, Ibuprofen and Colace.  She will follow up in the clinic weekly for quant HCG and in 2 weeks for postoperative evaluation.

## 2021-11-04 NOTE — Anesthesia Procedure Notes (Addendum)
Procedure Name: LMA Insertion Date/Time: 11/04/2021 1:21 PM Performed by: Carlos American, CRNA Pre-anesthesia Checklist: Patient identified, Emergency Drugs available, Suction available and Patient being monitored Patient Re-evaluated:Patient Re-evaluated prior to induction Oxygen Delivery Method: Circle System Utilized Preoxygenation: Pre-oxygenation with 100% oxygen Induction Type: IV induction Ventilation: Mask ventilation without difficulty LMA: LMA inserted LMA Size: 4.0 Number of attempts: 1 Placement Confirmation: positive ETCO2 Tube secured with: Tape Dental Injury: Teeth and Oropharynx as per pre-operative assessment

## 2021-11-04 NOTE — Anesthesia Preprocedure Evaluation (Addendum)
Anesthesia Evaluation  Patient identified by MRN, date of birth, ID band Patient awake    Reviewed: Allergy & Precautions, NPO status , Patient's Chart, lab work & pertinent test results  Airway Mallampati: II  TM Distance: >3 FB Neck ROM: Full  Mouth opening: Limited Mouth Opening  Dental  (+) Teeth Intact, Dental Advisory Given Multiple silver-capped teeth:   Pulmonary neg pulmonary ROS,    Pulmonary exam normal breath sounds clear to auscultation       Cardiovascular negative cardio ROS Normal cardiovascular exam Rhythm:Regular Rate:Normal     Neuro/Psych  Headaches, negative psych ROS   GI/Hepatic negative GI ROS, Neg liver ROS,   Endo/Other  Obesity BMI 36  Renal/GU negative Renal ROS  negative genitourinary   Musculoskeletal negative musculoskeletal ROS (+)   Abdominal   Peds negative pediatric ROS (+)  Hematology  (+) Blood dyscrasia, anemia , hct 32.2, plt 217   Anesthesia Other Findings   Reproductive/Obstetrics (+) Pregnancy G3P1, 1 prior section 2020 Presented to MAU w/ slow abruption, 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                              Anesthesia Physical  Anesthesia Plan  ASA: 2  Anesthesia Plan: General   Post-op Pain Management: Minimal or no pain anticipated, Tylenol PO (pre-op) and Toradol IV (intra-op)   Induction:   PONV Risk Score and Plan: 4 or greater and Ondansetron, Dexamethasone, Treatment may vary due to age or medical condition, Midazolam and Diphenhydramine  Airway Management Planned: LMA  Additional Equipment: None  Intra-op Plan:   Post-operative Plan: Extubation in OR  Informed Consent: I have reviewed the patients History and Physical, chart, labs and discussed the procedure including the risks, benefits and alternatives for the proposed anesthesia with the patient or authorized representative who has indicated his/her understanding and  acceptance.     Dental advisory given  Plan Discussed with: CRNA  Anesthesia Plan Comments:         Anesthesia Quick Evaluation

## 2021-11-04 NOTE — Progress Notes (Signed)
Pre-op interview and hand off with interpreter Viviana Simpler.

## 2021-11-04 NOTE — Transfer of Care (Signed)
Immediate Anesthesia Transfer of Care Note  Patient: Nichole Soto  Procedure(s) Performed: DILATATION AND EVACUATION OPERATIVE ULTRASOUND  Patient Location: PACU  Anesthesia Type:General  Level of Consciousness: drowsy and patient cooperative  Airway & Oxygen Therapy: Patient Spontanous Breathing  Post-op Assessment: Report given to RN and Post -op Vital signs reviewed and stable  Post vital signs: Reviewed and stable  Last Vitals:  Vitals Value Taken Time  BP 118/62   Temp    Pulse 85   Resp    SpO2 100     Last Pain:  Vitals:   11/04/21 1103  TempSrc:   PainSc: 0-No pain      Patients Stated Pain Goal: 2 (11/03/21 1301)  Complications: No notable events documented.

## 2021-11-04 NOTE — H&P (Signed)
Nichole Soto is an 30 y.o. female [phone] here for scheduled D&E due to ultrasound findings highly suspicious for molar pregnancy. Patient reports doing well and is without any other complaints. She denies pelvic pain or vaginal bleeding   Menstrual History:  No LMP recorded (lmp unknown). Patient is pregnant.    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 She has never used smokeless tobacco. She reports that she does not currently use alcohol. She reports that she does not use drugs.  Allergies: No Known Allergies  Medications Prior to Admission  Medication Sig Dispense Refill Last Dose   ibuprofen (ADVIL) 600 MG tablet TAKE 1 TABLET (600 MG TOTAL) BY MOUTH EVERY SIX HOURS AS NEEDED. (Patient not taking: Reported on 11/03/2021) 60 tablet 5 Not Taking    Review of Systems See pertinent in HPI. All other systems reviewed and non contributory Blood pressure 116/73, pulse 88, temperature 98.5 F (36.9 C), temperature source Oral, resp. rate 18, height 4\' 9"  (1.448 m), weight 64 kg, SpO2 99 %, unknown if currently breastfeeding. Physical Exam GENERAL: Well-developed, well-nourished female in no acute distress.  LUNGS: Clear to auscultation bilaterally.  HEART: Regular rate and  rhythm. ABDOMEN: Soft, nontender, nondistended. No organomegaly. PELVIC: Deferred to OR EXTREMITIES: No cyanosis, clubbing, or edema, 2+ distal pulses.  No results found for this or any previous visit (from the past 24 hour(s)).  OB LESS THAN 14 WEEKS WITH OB TRANSVAGINAL  Result Date: 11/02/2021 CLINICAL DATA:  First trimester pregnancy, uncertain LMP in dating EXAM: OBSTETRIC <14 WK 11/04/2021 AND TRANSVAGINAL OB US TECHNIQUE: Both transabdominal and transvaginal ultrasound examinations were performed for complete evaluation of the gestation as well as the maternal uterus, adnexal regions, and pelvic cul-de-sac. Transvaginal technique was performed to assess early pregnancy. COMPARISON:  None FINDINGS: Intrauterine gestational sac: Absent; small amount of fluid within endometrial canal early in the exam resolves by the completion of the study likely represent endometrial fluid at the lower uterine segment Yolk sac:  N/A Embryo:  N/A Cardiac Activity: N/A Heart Rate: N/A  bpm MSD:   mm    w     d CRL:    mm    w    d                  [place] EDC: Subchorionic hemorrhage:  N/A Maternal uterus/adnexae: Uterus anteverted, with anterior wall Caesarean section scar noted. Large heterogeneous mass identified centrally within the upper uterus, 7.5 x 6.3 x 7.0 cm, slightly increased echogenicity with multiple small cystic foci, most consistent with a molar pregnancy. No additional uterine mass. LEFT ovary normal size and morphology 3.5  x 1.8 x 2.3 cm. RIGHT ovary normal size and morphology 3.3 x 1.5 x 2.0 cm. No free pelvic fluid or adnexal masses. IMPRESSION: No intrauterine gestational sac identified. Large heterogeneous generally hyperechoic mass within the upper central uterus containing cystic foci, lesion overall 7.5 x 6.3 x 7.0 cm, most consistent with a molar pregnancy. These results will be called to the ordering clinician or representative by the Radiologist Assistant, and communication documented in the PACS or  [employer]. Electronically Signed   By: Ulyses Southward M.D.   On: 11/02/2021 14:23    Assessment/Plan: 30 yo P2 with ultrasound findings consistent with molar pregnancy - Patient here for surgical management - Risks, benefits and alternatives were reviewed and explained including but not limited to risks of bleeding, infection, uterine perforation and damage to adjacent organs. Patient verbalized understanding and all questions were answered.  Nichole Soto 11/04/2021, 11:20 AM

## 2021-11-05 ENCOUNTER — Encounter (HOSPITAL_COMMUNITY): Payer: Self-pay | Admitting: Obstetrics and Gynecology

## 2021-11-05 LAB — SURGICAL PATHOLOGY

## 2021-11-08 NOTE — Anesthesia Postprocedure Evaluation (Signed)
Anesthesia Post Note  Patient: Nichole Soto  Procedure(s) Performed: DILATATION AND EVACUATION OPERATIVE ULTRASOUND     Patient location during evaluation: PACU Anesthesia Type: General Level of consciousness: sedated and patient cooperative Pain management: pain level controlled Vital Signs Assessment: post-procedure vital signs reviewed and stable Respiratory status: spontaneous breathing Cardiovascular status: stable Anesthetic complications: no   No notable events documented.  Last Vitals:  Vitals:   11/04/21 1425 11/04/21 1440  BP: 110/78 114/69  Pulse: 75 69  Resp: 18 14  Temp:  36.9 C  SpO2: 100% 100%    Last Pain:  Vitals:   11/04/21 1440  TempSrc:   PainSc: 0-No pain                 Lewie Loron

## 2021-11-11 ENCOUNTER — Other Ambulatory Visit: Payer: Self-pay | Admitting: General Practice

## 2021-11-11 ENCOUNTER — Other Ambulatory Visit: Payer: Self-pay

## 2021-11-11 DIAGNOSIS — O020 Blighted ovum and nonhydatidiform mole: Secondary | ICD-10-CM

## 2021-11-12 LAB — BETA HCG QUANT (REF LAB): hCG Quant: 1355 m[IU]/mL

## 2021-11-19 ENCOUNTER — Other Ambulatory Visit: Payer: Self-pay

## 2021-11-19 ENCOUNTER — Encounter: Payer: Self-pay | Admitting: Obstetrics and Gynecology

## 2021-11-19 ENCOUNTER — Ambulatory Visit (INDEPENDENT_AMBULATORY_CARE_PROVIDER_SITE_OTHER): Payer: Self-pay | Admitting: Obstetrics and Gynecology

## 2021-11-19 VITALS — BP 113/63 | HR 86 | Wt 139.5 lb

## 2021-11-19 DIAGNOSIS — Z309 Encounter for contraceptive management, unspecified: Secondary | ICD-10-CM | POA: Insufficient documentation

## 2021-11-19 DIAGNOSIS — Z30013 Encounter for initial prescription of injectable contraceptive: Secondary | ICD-10-CM

## 2021-11-19 DIAGNOSIS — O020 Blighted ovum and nonhydatidiform mole: Secondary | ICD-10-CM

## 2021-11-19 DIAGNOSIS — Z789 Other specified health status: Secondary | ICD-10-CM

## 2021-11-19 MED ORDER — MEDROXYPROGESTERONE ACETATE 150 MG/ML IM SUSP
150.0000 mg | Freq: Once | INTRAMUSCULAR | Status: AC
  Administered 2021-11-19: 150 mg via INTRAMUSCULAR

## 2021-11-19 NOTE — Progress Notes (Signed)
Nichole Soto presents for follow from D & C for molar pregnancy on 11/04/21. She has no complaints today. Bleeding has stopped Denies any bowel or bladder dysfunction  Pathology reviewed with pt. BHCG 1,355 on 12/29   PE AF VSS Lungs clear Heart RRR Abd soft + BS  A/P Molar pregnancy s/p D & C  Serial BHCG's reviewed with pt. Depo Provera today for contraception and then pt will follow up with [facility] for Nexplanon insertion. BHCG today. F/U per test results. Advised to reframe from intercourse for 2 more weeks. Advised to avoid pregnancy x 1 yr.  Live interrupter used during today's visit

## 2021-11-19 NOTE — Patient Instructions (Signed)
Embarazo molar Molar Pregnancy Un embarazo molar (mola hidatiforme) es una masa de tejido que crece en el tero despus de una concepcin incorrecta. La masa no llega a ser un feto, y se considera un embarazo anormal. En general, el embarazo termina a causa de un aborto espontneo. En algunos casos, posiblemente se requiera tratamiento. Cules son las causas? Esta afeccin se da cuando un vulo es fecundado incorrectamente, de modo que tiene material gentico anormal (cromosomas). Esto puede generar uno de dos tipos de [place] molar: Embarazo molar completo. Se presenta cuando todos los cromosomas del vulo fecundado son del padre, y ninguno de Chief Strategy Officer. Embarazo molar parcial. Es cuando dos espermatozoides fecundan un vulo. Qu incrementa el riesgo? Es ms probable que esta afeccin se manifieste en: Mujeres de ms de 35 aos o de menos de [address] Mujeres que tuvieron un embarazo molar en el pasado. Entre otros factores de riesgo posibles se incluyen los siguientes: Fumar ms de 15 cigarrillos por Futures trader. Antecedentes de infertilidad o abortos espontneos. Tener falta (dficit) de vitamina A. Cules son los signos o sntomas? Los sntomas de esta afeccin incluyen: Sangrado vaginal. Nuseas y vmitos intensos. Presin o Financial risk analyst. Secrecin vaginal similar a uvas. Presin arterial alta. Cmo se diagnostica? [place] molar se diagnostica con Sherlyn Lees y con [place]. Cmo se trata? En general, los embarazos molares finalizan con un aborto espontneo. En algunos casos, el tratamiento puede incluir: [employer] [place] de hormonas del [employer], para verificar que disminuyan segn lo previsto. Usar un medicamento llamado inmunoglobulina Rho (D). Este medicamento ayuda a prevenir problemas que pueden ocurrir en futuros embarazos como resultado de una protena presente en los glbulos rojos (factor Rh). Pueden administrarle este medicamento  si no tiene factor Rh (usted es Rh negativo). Realizar un procedimiento llamado dilatacin y curetaje o curetaje por aspiracin. Se trata de procedimientos menores que implican raspar o Scientist, research (life sciences) molar para extraerlo del tero por la vagina. Incluso si un embarazo molar concluye por s solo, se puede realizar alguno de estos procedimientos para extraer todo el tejido anormal del tero. Realizar una extraccin quirrgica del tero (histerectoma). Tomar medicamentos, incluidos medicamentos de quimioterapia. Se recomienda cuando los niveles de las hormonas no disminuyen segn lo previsto. Siga estas instrucciones en su casa: Evite quedar embarazada durante 6 a 12 meses o segn las indicaciones del mdico. Para evitar un embarazo, no tenga relaciones sexuales o use algn mtodo anticonceptivo confiable cada vez que las tenga. Use los medicamentos de venta libre y los recetados solamente como se lo haya indicado el mdico. Descanse todo lo que sea necesario. Retome sus actividades normales segn lo indicado por el mdico. Pregntele al mdico qu actividades son seguras para usted. Considere la posibilidad de Advertising account planner en un grupo de apoyo. Si siente mucha tristeza, pdale ayuda al mdico. Cumpla con todas las visitas de seguimiento. Esto es importante. Es posible que deban realizarle anlisis de sangre o ecografas de seguimiento. Comunquese con un mdico si: Contina teniendo hemorragias vaginales irregulares. Tiene dolor en el abdomen. Resumen Un embarazo molar (mola hidatiforme) es una masa de tejido que crece en el tero despus de una concepcin incorrecta. Esta afeccin es ms frecuente en mujeres de ms de 35 aos o menos de 20 aos, o en las que ya han tenido un Psychiatrist molar en el pasado. El sntoma ms frecuente es la hemorragia vaginal. En general, el embarazo molar concluye con un aborto espontneo. Esta informacin  no tiene Theme park manager el consejo del mdico.  Asegrese de hacerle al mdico cualquier pregunta que tenga. Document Revised: 09/11/2020 Document Reviewed: 09/11/2020 Elsevier Patient Education  2022 [employer].

## 2021-11-20 LAB — BETA HCG QUANT (REF LAB): hCG Quant: 374 m[IU]/mL

## 2021-11-23 ENCOUNTER — Telehealth: Payer: Self-pay | Admitting: General Practice

## 2021-11-23 NOTE — Telephone Encounter (Signed)
Called patient and informed her of results & recommended follow up in one week. Scheduled non stat bhcg 1/16 @ 130 per patient request. Patient verbalized understanding to all. Raquel assisted with spanish interpretation.

## 2021-11-23 NOTE — Telephone Encounter (Signed)
-----   Message from Hermina Staggers, MD sent at 11/22/2021  9:10 PM EST ----- Please let pt know that her pregnancy hormonal level is decreasing. Please scheudle pt for repeat BHCG in 1 week, non stat.  Thanks Casimiro Needle

## 2021-11-29 ENCOUNTER — Other Ambulatory Visit: Payer: Self-pay

## 2021-11-29 ENCOUNTER — Other Ambulatory Visit: Payer: Self-pay | Admitting: *Deleted

## 2021-11-29 DIAGNOSIS — O020 Blighted ovum and nonhydatidiform mole: Secondary | ICD-10-CM

## 2021-11-30 ENCOUNTER — Other Ambulatory Visit: Payer: Self-pay

## 2021-11-30 DIAGNOSIS — O020 Blighted ovum and nonhydatidiform mole: Secondary | ICD-10-CM

## 2021-12-01 ENCOUNTER — Telehealth: Payer: Self-pay

## 2021-12-01 DIAGNOSIS — O020 Blighted ovum and nonhydatidiform mole: Secondary | ICD-10-CM

## 2021-12-01 LAB — BETA HCG QUANT (REF LAB): hCG Quant: 94 m[IU]/mL

## 2021-12-01 NOTE — Telephone Encounter (Addendum)
-----   Message from Hermina Staggers, MD sent at 12/01/2021 10:21 AM EST ----- Please let pt know that her pregnancy hormonal level continues to decrease. This is good. Please schedule pt for repeat BHCG in 1 week, non stat.  Thanks [employer] pt with Spanish Interpreter Eda R., notified pt of results and that the provider requested that she has f/u in one week non stat.  Pt requested to have an afternoon appt.  Pt scheduled for 12/07/21 @ 1530.  Future order placed for non stat beta.    Leonette Nutting  12/01/21

## 2021-12-07 ENCOUNTER — Other Ambulatory Visit: Payer: Self-pay

## 2021-12-07 ENCOUNTER — Telehealth: Payer: Self-pay

## 2021-12-08 ENCOUNTER — Other Ambulatory Visit: Payer: Self-pay

## 2021-12-08 DIAGNOSIS — O020 Blighted ovum and nonhydatidiform mole: Secondary | ICD-10-CM

## 2021-12-09 ENCOUNTER — Telehealth: Payer: Self-pay

## 2021-12-09 DIAGNOSIS — O020 Blighted ovum and nonhydatidiform mole: Secondary | ICD-10-CM

## 2021-12-09 LAB — BETA HCG QUANT (REF LAB): hCG Quant: 41 m[IU]/mL

## 2021-12-09 NOTE — Telephone Encounter (Addendum)
-----   Message from Chancy Milroy, MD sent at 12/09/2021  8:38 AM EST ----- Please let pt know that her BHCG continues to decrease. Repeat BHCG q weekly until < 5, non stat. Thanks [employer] pt with Spanish Interpreter Eda R., and informed pt her results.  I asked pt if she could please come in on 12/15/21 for another non stat beta to get levels to zero.  Pt scheduled for non stat beta at 1510 on 12/15/21.    Frances Nickels  \12/09/21

## 2021-12-15 ENCOUNTER — Other Ambulatory Visit: Payer: Self-pay

## 2021-12-15 DIAGNOSIS — O020 Blighted ovum and nonhydatidiform mole: Secondary | ICD-10-CM

## 2021-12-16 ENCOUNTER — Telehealth: Payer: Self-pay

## 2021-12-16 LAB — BETA HCG QUANT (REF LAB): hCG Quant: 28 m[IU]/mL

## 2021-12-16 NOTE — Telephone Encounter (Addendum)
-----   Message from Hermina Staggers, MD sent at 12/16/2021  8:31 AM EST ----- Please let pt know that BHCG continues to decrease. Please repeat BHCG in 1 week, non stat.  Thanks Jamesetta Orleans pt with Spanish interpreter Eda; results and provider recommendation given. Lab appt scheduled for 12/22/21 at 1530. Patient states she would like to transfer care to a [facility] location. Pt to speak with nurse about transferring care.

## 2021-12-21 ENCOUNTER — Encounter: Payer: Self-pay | Admitting: Family Medicine

## 2021-12-22 ENCOUNTER — Other Ambulatory Visit: Payer: Self-pay

## 2021-12-22 DIAGNOSIS — O020 Blighted ovum and nonhydatidiform mole: Secondary | ICD-10-CM

## 2021-12-25 LAB — BETA HCG QUANT (REF LAB): hCG Quant: 17 m[IU]/mL

## 2021-12-27 ENCOUNTER — Telehealth: Payer: Self-pay

## 2021-12-27 NOTE — Telephone Encounter (Addendum)
-----   Message from Hermina Staggers, MD sent at 12/27/2021  8:26 AM EST ----- Please let pt know that her BHCG continues to decrease. Repeat BHCG non stat in 1 week.  Thanks Jamesetta Orleans pt with interpreter Raquel; results and provider recommendation reviewed with pt. Lab appt scheduled for 01/03/22 at 1530.

## 2021-12-31 ENCOUNTER — Other Ambulatory Visit: Payer: Self-pay

## 2021-12-31 DIAGNOSIS — O020 Blighted ovum and nonhydatidiform mole: Secondary | ICD-10-CM

## 2022-01-01 LAB — BETA HCG QUANT (REF LAB): hCG Quant: 11 m[IU]/mL

## 2022-01-03 ENCOUNTER — Telehealth: Payer: Self-pay

## 2022-01-03 ENCOUNTER — Other Ambulatory Visit: Payer: Self-pay

## 2022-01-03 NOTE — Telephone Encounter (Addendum)
-----   Message from Nichole Staggers, MD sent at 01/01/2022  8:51 AM EST ----- Please let Nichole Soto know that her BHCG continues to decrease. Continue with weekly non stat BHCG's  Thanks Nichole Soto Nichole Soto with Spanish Interpreter Nichole M., and left message that I am calling with results and appt.    Nichole Soto  01/03/22

## 2022-01-04 NOTE — Telephone Encounter (Signed)
Called pt for second attempt with interpreter Raquel. VM left stating we are calling with results and pt has been scheduled for new lab appt this Friday, 01/07/22 at 10 AM.

## 2022-01-07 ENCOUNTER — Other Ambulatory Visit: Payer: Self-pay

## 2022-01-07 DIAGNOSIS — O020 Blighted ovum and nonhydatidiform mole: Secondary | ICD-10-CM

## 2022-01-10 ENCOUNTER — Other Ambulatory Visit: Payer: Self-pay

## 2022-01-10 DIAGNOSIS — O020 Blighted ovum and nonhydatidiform mole: Secondary | ICD-10-CM

## 2022-01-11 LAB — BETA HCG QUANT (REF LAB): hCG Quant: 6 m[IU]/mL

## 2022-01-13 ENCOUNTER — Telehealth: Payer: Self-pay

## 2022-01-13 NOTE — Telephone Encounter (Addendum)
-----   Message from Hermina Staggers, MD sent at 01/13/2022  4:17 PM EST ----- ?Please BHCG weekly until otherwise notified. Non stat. ?Thanks ?Casimiro Needle  ? ?Called pt with [facility] Interpreter # [phone] and left message that I am calling with results and to schedule a f/u appt.  Please call the office.  ? ?Taelyr Jantz,RN  ?01/13/22 ?

## 2022-01-17 ENCOUNTER — Telehealth: Payer: Self-pay | Admitting: *Deleted

## 2022-01-17 DIAGNOSIS — O020 Blighted ovum and nonhydatidiform mole: Secondary | ICD-10-CM

## 2022-01-17 NOTE — Telephone Encounter (Signed)
I called patient with Interpreter Lorinda Creed and informed her of results and recommendations per Dr. Alysia Penna and offered lab appointment. She agreed to 01/19/22 appt. ?Nancy Fetter ?

## 2022-01-18 ENCOUNTER — Other Ambulatory Visit: Payer: Self-pay

## 2022-01-18 DIAGNOSIS — O020 Blighted ovum and nonhydatidiform mole: Secondary | ICD-10-CM

## 2022-01-19 ENCOUNTER — Telehealth: Payer: Self-pay

## 2022-01-19 ENCOUNTER — Other Ambulatory Visit: Payer: Self-pay

## 2022-01-19 LAB — BETA HCG QUANT (REF LAB): hCG Quant: 4 m[IU]/mL

## 2022-01-19 NOTE — Telephone Encounter (Addendum)
-----   Message from Chancy Milroy, MD sent at 01/19/2022 11:48 AM EST ----- ?Non stat BHCG in 1 week ?Thanks ?Nichole Soto  ? ? ?Called pt with interpreter Eda; results and provider recommendation given. Pt to return for lab visit on 12/28/21 at 3:30 PM.  ?

## 2022-01-25 ENCOUNTER — Other Ambulatory Visit: Payer: Self-pay

## 2022-01-25 ENCOUNTER — Other Ambulatory Visit: Payer: Self-pay | Admitting: General Practice

## 2022-01-25 DIAGNOSIS — O020 Blighted ovum and nonhydatidiform mole: Secondary | ICD-10-CM

## 2022-01-26 LAB — BETA HCG QUANT (REF LAB): hCG Quant: 2 m[IU]/mL

## 2022-01-28 ENCOUNTER — Telehealth: Payer: Self-pay | Admitting: *Deleted

## 2022-01-28 ENCOUNTER — Encounter: Payer: Self-pay | Admitting: Obstetrics and Gynecology

## 2022-01-28 DIAGNOSIS — O020 Blighted ovum and nonhydatidiform mole: Secondary | ICD-10-CM

## 2022-01-28 NOTE — Telephone Encounter (Addendum)
-----   Message from Trosky Bing, MD sent at 01/28/2022  6:48 AM EDT ----- ?Please let her number looks good and she will need a beta hcg in a month and to get a monthly hcg for the next three months. Thanks ? ?3/17  1220  Called pt w/[employer] interpreter [phone]. I advised of test result and recommendation for monthly hormone level x3.  Pt voiced understanding and agreed to next appt on 4/19 @ 3 pm.  ?

## 2022-03-02 ENCOUNTER — Other Ambulatory Visit: Payer: Self-pay

## 2022-03-10 ENCOUNTER — Other Ambulatory Visit: Payer: Self-pay

## 2023-03-14 ENCOUNTER — Ambulatory Visit (INDEPENDENT_AMBULATORY_CARE_PROVIDER_SITE_OTHER): Payer: Self-pay

## 2023-03-14 DIAGNOSIS — Z3201 Encounter for pregnancy test, result positive: Secondary | ICD-10-CM

## 2023-03-14 DIAGNOSIS — Z3200 Encounter for pregnancy test, result unknown: Secondary | ICD-10-CM

## 2023-03-14 NOTE — Progress Notes (Unsigned)
Possible Pregnancy  Here today to leave urine specimen for pregnancy confirmation. UPT in office today is positive. Called pt with results. Pt reports first positive home UPT on ***. Reviewed dating with patient:   LMP: *** EDD: *** ***w ***d today  OB history reviewed. Reviewed medications and allergies with patient; list of medications safe to take during pregnancy given.  Recommended pt begin prenatal vitamin and schedule prenatal care.  Marjo Bicker, RN 03/14/2023  2:21 PM

## 2023-03-15 LAB — POCT PREGNANCY, URINE: Preg Test, Ur: POSITIVE — AB

## 2023-04-12 ENCOUNTER — Telehealth: Payer: Self-pay

## 2023-04-12 NOTE — Telephone Encounter (Signed)
Called pt with Spanish interpreter Eda. Pt given information for [employer] contact [place]. Will contact to see if she is eligible and will notify our office at new OB intake on 04/25/23.

## 2023-04-18 LAB — OB RESULTS CONSOLE PLATELET COUNT: Platelets: 256

## 2023-04-18 LAB — OB RESULTS CONSOLE ANTIBODY SCREEN: Antibody Screen: NEGATIVE

## 2023-04-18 LAB — OB RESULTS CONSOLE ABO/RH: RH Type: POSITIVE

## 2023-04-18 LAB — OB RESULTS CONSOLE GC/CHLAMYDIA
Chlamydia: NEGATIVE
Neisseria Gonorrhea: NEGATIVE

## 2023-04-18 LAB — OB RESULTS CONSOLE RPR: RPR: NONREACTIVE

## 2023-04-18 LAB — OB RESULTS CONSOLE HGB/HCT, BLOOD
HCT: 31 (ref 29–41)
Hemoglobin: 9.9

## 2023-04-18 LAB — OB RESULTS CONSOLE VARICELLA ZOSTER ANTIBODY, IGG: Varicella: IMMUNE

## 2023-04-18 LAB — OB RESULTS CONSOLE GBS: GBS: POSITIVE

## 2023-04-25 ENCOUNTER — Telehealth (INDEPENDENT_AMBULATORY_CARE_PROVIDER_SITE_OTHER): Payer: Self-pay

## 2023-04-25 DIAGNOSIS — O0990 Supervision of high risk pregnancy, unspecified, unspecified trimester: Secondary | ICD-10-CM | POA: Insufficient documentation

## 2023-04-25 DIAGNOSIS — Z3689 Encounter for other specified antenatal screening: Secondary | ICD-10-CM

## 2023-04-25 NOTE — Progress Notes (Signed)
New OB Intake  I connected with Nichole Soto  on 04/25/23 at  8:15 AM EDT by telephone Video Visit and verified that I am speaking with the correct person using two identifiers. Nurse is located at [facility] and pt is located at home.  I discussed the limitations, risks, security and privacy concerns of performing an evaluation and management service by telephone and the availability of in person appointments. I also discussed with the patient that there may be a patient responsible charge related to this service. The patient expressed understanding and agreed to proceed.  I explained I am completing New OB Intake today. We discussed EDD of 09/09/23. Pt is [id number]. I reviewed her allergies, medications and Medical/Surgical/OB history.    Patient Active Problem List   Diagnosis Date Noted   Supervision of high risk pregnancy, antepartum 04/25/2023   Molar pregnancy 11/02/2021   Gallstones 09/10/2020   Nonintractable headache 09/10/2020   Spots in front of the eye 09/10/2020   Language barrier 07/29/2019    Concerns addressed today  Delivery Plans Plans to deliver at [facility]. Discussed the nature of our practice with multiple providers including residents and students. Due to the size of the practice, the delivering provider may not be the same as those providing prenatal care.   Patient is not interested in water birth. Offered upcoming OB visit with CNM to discuss further.  MyChart/Babyscripts MyChart access verified. I explained pt will have some visits in office and some virtually. Babyscripts instructions given and order placed. Patient verifies receipt of registration text/e-mail. Account successfully created and app downloaded.  Blood Pressure Cuff/Weight Scale Patient is self-pay; explained patient will be given BP cuff at first prenatal appt. Explained after first prenatal appt pt will check weekly and document in Babyscripts. Patient does have weight  scale.  Anatomy US Explained first scheduled [place] will be around 19 weeks. Anatomy US scheduled for 05/25/23 at 0115p.  Is patient a CenteringPregnancy candidate?  Not a Candidate  Not a candidate due to  No open class     Is patient a Mom+Baby Combined Care candidate?  Not a candidate     Interested in [place]? If yes, send referral and doula dot phrase.    First visit review I reviewed new OB appt with patient. Explained pt will be seen by Dorathy Kinsman, CNM at first visit. Discussed Avelina Laine genetic screening with patient. Yes Panorama and Horizon.. Routine prenatal labs are needed.   Last Pap Diagnosis  Date Value Ref Range Status  08/08/2019   Final   - Negative for intraepithelial lesion or malignancy (NILM)    Nichole Soto, CMA 04/25/2023  9:13 AM

## 2023-04-27 ENCOUNTER — Other Ambulatory Visit (HOSPITAL_COMMUNITY)
Admission: RE | Admit: 2023-04-27 | Discharge: 2023-04-27 | Disposition: A | Discharge status: Home / Self Care | Payer: Self-pay | Source: Ambulatory Visit | Attending: Advanced Practice Midwife | Admitting: Advanced Practice Midwife

## 2023-04-27 ENCOUNTER — Ambulatory Visit (INDEPENDENT_AMBULATORY_CARE_PROVIDER_SITE_OTHER): Payer: Self-pay | Admitting: Advanced Practice Midwife

## 2023-04-27 ENCOUNTER — Other Ambulatory Visit: Payer: Self-pay

## 2023-04-27 VITALS — BP 105/66 | HR 86 | Wt 154.2 lb

## 2023-04-27 DIAGNOSIS — O34219 Maternal care for unspecified type scar from previous cesarean delivery: Secondary | ICD-10-CM

## 2023-04-27 DIAGNOSIS — Z3A20 20 weeks gestation of pregnancy: Secondary | ICD-10-CM

## 2023-04-27 DIAGNOSIS — O0990 Supervision of high risk pregnancy, unspecified, unspecified trimester: Secondary | ICD-10-CM | POA: Insufficient documentation

## 2023-04-27 DIAGNOSIS — O0992 Supervision of high risk pregnancy, unspecified, second trimester: Secondary | ICD-10-CM

## 2023-04-27 DIAGNOSIS — Z758 Other problems related to medical facilities and other health care: Secondary | ICD-10-CM

## 2023-04-27 DIAGNOSIS — Z603 Acculturation difficulty: Secondary | ICD-10-CM

## 2023-04-27 DIAGNOSIS — Z8759 Personal history of other complications of pregnancy, childbirth and the puerperium: Secondary | ICD-10-CM

## 2023-04-27 DIAGNOSIS — Q513 Bicornate uterus: Secondary | ICD-10-CM

## 2023-04-27 DIAGNOSIS — O3402 Maternal care for unspecified congenital malformation of uterus, second trimester: Secondary | ICD-10-CM

## 2023-04-27 NOTE — Progress Notes (Signed)
INITIAL PRENATAL VISIT  Subjective:   Nichole Soto is 32 y.o. [phone] female being seen today for her first obstetrical visit. She is not received other prenatal care previously this pregnancy. This is a planned pregnancy. This is a desired pregnancy.  She is at [redacted]w[redacted]d gestation by LMP. 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 Relationship with FOB:  supportive . Patient does intend to breast feed. Pregnancy history fully reviewed.  Review of Systems:   ROS no complaints.  Objective:    Obstetric History OB History  Gravida Para Term Preterm AB Living  5 3 1 1 1 2   SAB IAB Ectopic Multiple Live Births  0 0 0 0 3    # Outcome Date GA Lbr Len/2nd Weight Sex Delivery Anes PTL Lv  5 Current           4 Preterm 12/12/20 [redacted]w[redacted]d  6 lb 8.8 oz (2.97 kg) M CS-LTranv Spinal  LIV     Birth Comments: preterm 35 wks     Complications: Abruptio Placenta, Breech presentation  3 Para 08/31/19 [redacted]w[redacted]d 00:45  F Vag-Spont None  ND     Birth Comments: est 20 weeks      Complications: Preterm premature rupture of membranes (PPROM) with onset of labor within 24 hours of rupture in second trimester, antepartum  2 Term 08/10/11   7 lb 8 oz (3.402 kg) M CS-Unspec EPI  LIV     Complications: Malpresentation of fetus  1 Molar             Obstetric Comments  G1: 2012, term c/s for transverse. Pt never told she couldn't labor in the future.     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   Twin 2 in left side of uterus early first trimester    Past Surgical History:  Procedure Laterality Date   CESAREAN SECTION     CESAREAN SECTION  12/12/2020   Procedure: CESAREAN SECTION;  Surgeon: Warden Fillers, MD;  Location: [facility] LD ORS;  Service: Obstetrics;;   DILATION AND EVACUATION N/A 11/04/2021   Procedure: DILATATION AND EVACUATION;  Surgeon: Catalina Antigua, MD;  Location: [facility] OR;  Service: Gynecology;  Laterality: N/A;   GALLBLADDER SURGERY  2022   OPERATIVE ULTRASOUND N/A 11/04/2021   Procedure: OPERATIVE ULTRASOUND;  Surgeon: Catalina Antigua, MD;  Location: [facility] OR;  Service: Gynecology;  Laterality: N/A;    Current Outpatient Medications on File Prior to Visit  Medication Sig Dispense Refill   Prenatal Vit-Fe Fumarate-FA (MULTIVITAMIN-PRENATAL) 27-0.8 MG TABS tablet Take 1 tablet by mouth daily at 12 noon.     No current facility-administered medications on file prior to visit.    No Known Allergies  Social History:  reports that she has never smoked. She has never used smokeless tobacco. She reports that she does not currently use alcohol. She reports that she does not use drugs.  Family History  Problem Relation Age of Onset   Cancer  Neg Hx    Diabetes Neg Hx    Hypertension Neg Hx     The following portions of the patient's history were reviewed and updated as appropriate: allergies, current medications, past family history, past medical history, past social history, past surgical history and problem list.  Physical Exam:  BP 105/66   Pulse 86   Wt 154 lb 3.2 oz (69.9 kg)   LMP 12/03/2022   BMI 33.37 kg/m  CONSTITUTIONAL: Well-developed, well-nourished female in no acute distress.  HENT:  Normocephalic, atraumatic. Oropharynx is clear and moist EYES: Conjunctivae normal. No scleral icterus.  SKIN: Skin is warm and dry. No rash noted. Not diaphoretic. No erythema. No pallor. MUSCULOSKELETAL: Normal range of motion. No tenderness.  No  cyanosis, clubbing, or edema.   NEUROLOGIC: Alert and oriented to person, place, and time. Normal muscle tone coordination.  PSYCHIATRIC: Normal mood and affect. Normal behavior. Normal judgment and thought content. CARDIOVASCULAR: Normal heart rate noted. RESPIRATORY: Effort and rate normal. BREASTS: Declined ABDOMEN: Soft, no distention, tenderness, rebound or guarding. Fundal ht: 2/U PELVIC: Normal appearing external genitalia; normal appearing vaginal mucosa and cervix.  No abnormal discharge noted.  Pap smear not obtained. WIll request financial assistance and do at that time.  Uterus S=D, no other palpable masses, no uterine or adnexal tenderness. Fetal Status: Fetal Heart Rate (bpm): 140   Movement: Present     Indications for ASA therapy (per uptodate) One of the following: Previous pregnancy with preeclampsia, especially early onset and with an adverse outcome No Multifetal gestation No Chronic hypertension No Type 1 or 2 diabetes mellitus No Chronic kidney disease No Autoimmune disease (antiphospholipid syndrome, systemic lupus erythematosus) No  Two or more of the following: Nulliparity No Obesity (body mass index >30 kg/m2) Yes Family history of preeclampsia in mother or sister No Age ?35 years No Sociodemographic characteristics (African American race, low socioeconomic level) Yes Personal risk factors (eg, previous pregnancy with low birth weight or small for gestational age infant, previous adverse pregnancy outcome [eg, stillbirth], interval >10 years between pregnancies) No  Indications for early GDM screening  Done  Early screening tests: FBS, A1C, Random CBG, glucose challenge   Assessment:   Pregnancy: [id number] 1. Supervision of high risk pregnancy, antepartum  - CBC/D/Plt+RPR+Rh+ABO+RubIgG... - Culture, OB Urine - Hemoglobin A1c - GC/Chlamydia probe amp ([place])not at [facility]  2. Language barrier - INterpreter used  3. History of molar pregnancy -  Nml F/U HCGs  4. History of preterm premature rupture of membranes (PPROM) - Check CL w/ anatomy US  5. History of cesarean delivery, currently pregnant - Done for malpresentation and possible abruption - Considering TOLAC  6. 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 [redacted] weeks gestation of pregnancy      Plan:  Initial labs drawn/reviewed. Prenatal vitamins. Rx ASA  for reduction of risk for preeclampsia.  Problem list reviewed and updated. Genetic screening discussed: NIPS/First trimester screen/Quad/AFP declined. Role of ultrasound in pregnancy discussed; Anatomy US: ordered. Amniocentesis discussed: not indicated. Follow up in 4 weeks. Traditional Discussed clinic routines, schedule of care and testing, genetic screening options, involvement of students and residents under the direct supervision of APPs and doctors and presence of female providers. Pt verbalized understanding.  [place], [place] 04/27/2023 3:14 PM

## 2023-04-27 NOTE — Progress Notes (Signed)
Declines genetic testing. Nichole Soto

## 2023-04-28 ENCOUNTER — Encounter: Payer: Self-pay | Admitting: Advanced Practice Midwife

## 2023-04-28 DIAGNOSIS — Z8759 Personal history of other complications of pregnancy, childbirth and the puerperium: Secondary | ICD-10-CM | POA: Insufficient documentation

## 2023-04-28 DIAGNOSIS — O34219 Maternal care for unspecified type scar from previous cesarean delivery: Secondary | ICD-10-CM | POA: Insufficient documentation

## 2023-04-28 LAB — CBC/D/PLT+RPR+RH+ABO+RUBIGG...
Antibody Screen: NEGATIVE
Basophils Absolute: 0 10*3/uL (ref 0.0–0.2)
Basos: 0 %
EOS (ABSOLUTE): 0.1 10*3/uL (ref 0.0–0.4)
Eos: 1 %
HCV Ab: NONREACTIVE
HIV Screen 4th Generation wRfx: NONREACTIVE
Hematocrit: 30.9 % — ABNORMAL LOW (ref 34.0–46.6)
Hemoglobin: 10.1 g/dL — ABNORMAL LOW (ref 11.1–15.9)
Hepatitis B Surface Ag: NEGATIVE
Immature Grans (Abs): 0.1 10*3/uL (ref 0.0–0.1)
Immature Granulocytes: 1 %
Lymphocytes Absolute: 1.3 10*3/uL (ref 0.7–3.1)
Lymphs: 15 %
MCH: 26.3 pg — ABNORMAL LOW (ref 26.6–33.0)
MCHC: 32.7 g/dL (ref 31.5–35.7)
MCV: 81 fL (ref 79–97)
Monocytes Absolute: 0.5 10*3/uL (ref 0.1–0.9)
Monocytes: 6 %
Neutrophils Absolute: 6.3 10*3/uL (ref 1.4–7.0)
Neutrophils: 77 %
Platelets: 257 10*3/uL (ref 150–450)
RBC: 3.84 x10E6/uL (ref 3.77–5.28)
RDW: 16.7 % — ABNORMAL HIGH (ref 11.7–15.4)
RPR Ser Ql: NONREACTIVE
Rh Factor: POSITIVE
Rubella Antibodies, IGG: 1.81 index (ref >0.99)
WBC: 8.3 10*3/uL (ref 3.4–10.8)

## 2023-04-28 LAB — HCV INTERPRETATION

## 2023-04-28 LAB — GC/CHLAMYDIA PROBE AMP (~~LOC~~) NOT AT ARMC
Chlamydia: NEGATIVE
Comment: NEGATIVE
Comment: NORMAL
Neisseria Gonorrhea: NEGATIVE

## 2023-04-28 LAB — HEMOGLOBIN A1C
Est. average glucose Bld gHb Est-mCnc: 105 mg/dL
Hgb A1c MFr Bld: 5.3 % (ref 4.8–5.6)

## 2023-04-28 MED ORDER — ASPIRIN 81 MG PO TBEC
81.0000 mg | DELAYED_RELEASE_TABLET | Freq: Every day | ORAL | 2 refills | Status: AC
Start: 2023-04-28 — End: ?

## 2023-05-02 LAB — CULTURE, OB URINE

## 2023-05-02 LAB — URINE CULTURE, OB REFLEX

## 2023-05-10 ENCOUNTER — Encounter: Payer: Self-pay | Admitting: Advanced Practice Midwife

## 2023-05-10 DIAGNOSIS — R8271 Bacteriuria: Secondary | ICD-10-CM | POA: Insufficient documentation

## 2023-05-11 ENCOUNTER — Telehealth: Payer: Self-pay

## 2023-05-11 NOTE — Telephone Encounter (Addendum)
-----   Message from [place], [place] sent at 05/10/2023  5:12 PM EDT ----- Please inform pt that she has a low amount on Group B strep in her urine. She will need an antibiotic in labor but it is not enough to be considered bladder infection or require antibiotics right now. Needs Spanish interpreter.   Called pt with interpreter Eda. Results reviewed. Pt asked when she will be having ultrasound; reviewed upcoming appt with MFM.

## 2023-05-16 ENCOUNTER — Telehealth: Payer: Self-pay

## 2023-05-22 DIAGNOSIS — O99210 Obesity complicating pregnancy, unspecified trimester: Secondary | ICD-10-CM | POA: Insufficient documentation

## 2023-05-25 ENCOUNTER — Ambulatory Visit: Payer: Self-pay | Admitting: *Deleted

## 2023-05-25 ENCOUNTER — Encounter: Payer: Self-pay | Admitting: Family Medicine

## 2023-05-25 ENCOUNTER — Ambulatory Visit: Payer: Self-pay | Attending: Advanced Practice Midwife

## 2023-05-25 ENCOUNTER — Encounter: Payer: Self-pay | Admitting: *Deleted

## 2023-05-25 ENCOUNTER — Other Ambulatory Visit: Payer: Self-pay | Admitting: *Deleted

## 2023-05-25 ENCOUNTER — Other Ambulatory Visit: Payer: Self-pay | Admitting: Advanced Practice Midwife

## 2023-05-25 VITALS — BP 125/55 | HR 83

## 2023-05-25 DIAGNOSIS — O09899 Supervision of other high risk pregnancies, unspecified trimester: Secondary | ICD-10-CM

## 2023-05-25 DIAGNOSIS — Z8759 Personal history of other complications of pregnancy, childbirth and the puerperium: Secondary | ICD-10-CM

## 2023-05-25 DIAGNOSIS — Z98891 History of uterine scar from previous surgery: Secondary | ICD-10-CM

## 2023-05-25 DIAGNOSIS — R8271 Bacteriuria: Secondary | ICD-10-CM | POA: Insufficient documentation

## 2023-05-25 DIAGNOSIS — O99212 Obesity complicating pregnancy, second trimester: Secondary | ICD-10-CM

## 2023-05-25 DIAGNOSIS — O0990 Supervision of high risk pregnancy, unspecified, unspecified trimester: Secondary | ICD-10-CM

## 2023-05-25 DIAGNOSIS — O3400 Maternal care for unspecified congenital malformation of uterus, unspecified trimester: Secondary | ICD-10-CM

## 2023-06-22 ENCOUNTER — Ambulatory Visit: Payer: Self-pay | Attending: Maternal & Fetal Medicine

## 2023-06-22 ENCOUNTER — Other Ambulatory Visit: Payer: Self-pay | Admitting: *Deleted

## 2023-06-22 DIAGNOSIS — O34219 Maternal care for unspecified type scar from previous cesarean delivery: Secondary | ICD-10-CM

## 2023-06-22 DIAGNOSIS — O3663X0 Maternal care for excessive fetal growth, third trimester, not applicable or unspecified: Secondary | ICD-10-CM

## 2023-06-22 DIAGNOSIS — Q5122 Other partial doubling of uterus: Secondary | ICD-10-CM

## 2023-06-22 DIAGNOSIS — Z8759 Personal history of other complications of pregnancy, childbirth and the puerperium: Secondary | ICD-10-CM | POA: Insufficient documentation

## 2023-06-22 DIAGNOSIS — O99213 Obesity complicating pregnancy, third trimester: Secondary | ICD-10-CM

## 2023-06-22 DIAGNOSIS — O99212 Obesity complicating pregnancy, second trimester: Secondary | ICD-10-CM | POA: Insufficient documentation

## 2023-06-22 DIAGNOSIS — O99013 Anemia complicating pregnancy, third trimester: Secondary | ICD-10-CM

## 2023-06-22 DIAGNOSIS — O09213 Supervision of pregnancy with history of pre-term labor, third trimester: Secondary | ICD-10-CM

## 2023-06-22 DIAGNOSIS — Z3A28 28 weeks gestation of pregnancy: Secondary | ICD-10-CM

## 2023-06-22 DIAGNOSIS — O0933 Supervision of pregnancy with insufficient antenatal care, third trimester: Secondary | ICD-10-CM

## 2023-06-22 DIAGNOSIS — Z98891 History of uterine scar from previous surgery: Secondary | ICD-10-CM | POA: Insufficient documentation

## 2023-06-22 DIAGNOSIS — D649 Anemia, unspecified: Secondary | ICD-10-CM

## 2023-06-22 DIAGNOSIS — O3400 Maternal care for unspecified congenital malformation of uterus, unspecified trimester: Secondary | ICD-10-CM | POA: Insufficient documentation

## 2023-06-22 DIAGNOSIS — O09899 Supervision of other high risk pregnancies, unspecified trimester: Secondary | ICD-10-CM | POA: Insufficient documentation

## 2023-06-22 DIAGNOSIS — O09293 Supervision of pregnancy with other poor reproductive or obstetric history, third trimester: Secondary | ICD-10-CM

## 2023-06-22 DIAGNOSIS — E669 Obesity, unspecified: Secondary | ICD-10-CM

## 2023-06-22 DIAGNOSIS — O3403 Maternal care for unspecified congenital malformation of uterus, third trimester: Secondary | ICD-10-CM

## 2023-06-22 DIAGNOSIS — Q5128 Other doubling of uterus, other specified: Secondary | ICD-10-CM

## 2023-06-22 DIAGNOSIS — O3660X0 Maternal care for excessive fetal growth, unspecified trimester, not applicable or unspecified: Secondary | ICD-10-CM

## 2023-06-22 DIAGNOSIS — Q513 Bicornate uterus: Secondary | ICD-10-CM | POA: Insufficient documentation

## 2023-07-20 ENCOUNTER — Ambulatory Visit: Payer: Self-pay | Attending: Maternal & Fetal Medicine

## 2023-07-20 DIAGNOSIS — O0933 Supervision of pregnancy with insufficient antenatal care, third trimester: Secondary | ICD-10-CM

## 2023-07-20 DIAGNOSIS — O3403 Maternal care for unspecified congenital malformation of uterus, third trimester: Secondary | ICD-10-CM | POA: Insufficient documentation

## 2023-07-20 DIAGNOSIS — Q5128 Other doubling of uterus, other specified: Secondary | ICD-10-CM | POA: Insufficient documentation

## 2023-07-20 DIAGNOSIS — Z8759 Personal history of other complications of pregnancy, childbirth and the puerperium: Secondary | ICD-10-CM | POA: Insufficient documentation

## 2023-07-20 DIAGNOSIS — O09293 Supervision of pregnancy with other poor reproductive or obstetric history, third trimester: Secondary | ICD-10-CM

## 2023-07-20 DIAGNOSIS — O34593 Maternal care for other abnormalities of gravid uterus, third trimester: Secondary | ICD-10-CM

## 2023-07-20 DIAGNOSIS — O3660X0 Maternal care for excessive fetal growth, unspecified trimester, not applicable or unspecified: Secondary | ICD-10-CM | POA: Insufficient documentation

## 2023-07-20 DIAGNOSIS — Z3A32 32 weeks gestation of pregnancy: Secondary | ICD-10-CM

## 2023-07-20 DIAGNOSIS — O09213 Supervision of pregnancy with history of pre-term labor, third trimester: Secondary | ICD-10-CM

## 2023-07-20 DIAGNOSIS — O99013 Anemia complicating pregnancy, third trimester: Secondary | ICD-10-CM

## 2023-07-20 DIAGNOSIS — E669 Obesity, unspecified: Secondary | ICD-10-CM

## 2023-07-20 DIAGNOSIS — O34219 Maternal care for unspecified type scar from previous cesarean delivery: Secondary | ICD-10-CM

## 2023-07-20 DIAGNOSIS — O3663X0 Maternal care for excessive fetal growth, third trimester, not applicable or unspecified: Secondary | ICD-10-CM

## 2023-07-20 DIAGNOSIS — O99213 Obesity complicating pregnancy, third trimester: Secondary | ICD-10-CM

## 2023-07-20 DIAGNOSIS — D649 Anemia, unspecified: Secondary | ICD-10-CM

## 2023-07-21 ENCOUNTER — Other Ambulatory Visit: Payer: Self-pay | Admitting: *Deleted

## 2023-07-21 DIAGNOSIS — O34219 Maternal care for unspecified type scar from previous cesarean delivery: Secondary | ICD-10-CM

## 2023-07-21 DIAGNOSIS — O99213 Obesity complicating pregnancy, third trimester: Secondary | ICD-10-CM

## 2023-07-21 DIAGNOSIS — O3663X0 Maternal care for excessive fetal growth, third trimester, not applicable or unspecified: Secondary | ICD-10-CM

## 2023-07-25 ENCOUNTER — Ambulatory Visit (INDEPENDENT_AMBULATORY_CARE_PROVIDER_SITE_OTHER): Payer: Self-pay | Admitting: Family Medicine

## 2023-07-25 ENCOUNTER — Other Ambulatory Visit: Payer: Self-pay

## 2023-07-25 VITALS — BP 111/61 | HR 90 | Wt 163.7 lb

## 2023-07-25 DIAGNOSIS — Z3A33 33 weeks gestation of pregnancy: Secondary | ICD-10-CM

## 2023-07-25 DIAGNOSIS — O34219 Maternal care for unspecified type scar from previous cesarean delivery: Secondary | ICD-10-CM

## 2023-07-25 DIAGNOSIS — R8271 Bacteriuria: Secondary | ICD-10-CM

## 2023-07-25 DIAGNOSIS — Z8759 Personal history of other complications of pregnancy, childbirth and the puerperium: Secondary | ICD-10-CM

## 2023-07-25 DIAGNOSIS — O3403 Maternal care for unspecified congenital malformation of uterus, third trimester: Secondary | ICD-10-CM

## 2023-07-25 DIAGNOSIS — O99213 Obesity complicating pregnancy, third trimester: Secondary | ICD-10-CM

## 2023-07-25 DIAGNOSIS — K8020 Calculus of gallbladder without cholecystitis without obstruction: Secondary | ICD-10-CM

## 2023-07-25 DIAGNOSIS — Q513 Bicornate uterus: Secondary | ICD-10-CM

## 2023-07-25 DIAGNOSIS — Z758 Other problems related to medical facilities and other health care: Secondary | ICD-10-CM

## 2023-07-25 DIAGNOSIS — Z603 Acculturation difficulty: Secondary | ICD-10-CM

## 2023-07-25 DIAGNOSIS — O3663X0 Maternal care for excessive fetal growth, third trimester, not applicable or unspecified: Secondary | ICD-10-CM

## 2023-07-25 DIAGNOSIS — O0990 Supervision of high risk pregnancy, unspecified, unspecified trimester: Secondary | ICD-10-CM

## 2023-07-25 NOTE — Progress Notes (Signed)
   Subjective:  Nichole Soto is a 32 y.o. [phone] at [redacted]w[redacted]d being seen today for ongoing prenatal care.  Nichole Soto is currently monitored for the following issues for this high-risk pregnancy and has Language barrier; Bicornate uterus complicating pregnancy; Gallstones; LGA (large for gestational age) fetus affecting management of mother; History of molar pregnancy; Supervision of high risk pregnancy, antepartum; History of preterm premature rupture of membranes (PPROM); History of cesarean delivery, currently pregnant; Group B streptococcal bacteriuria; and Obesity affecting pregnancy on their problem list.  Nichole Soto reports no complaints.  Contractions: Irritability. Vag. Bleeding: None.  Movement: Present. Denies leaking of fluid.   The following portions of the Nichole Soto's history were reviewed and updated as appropriate: allergies, current medications, past family history, past medical history, past social history, past surgical history and problem list. Problem list updated.  Objective:   Vitals:   07/25/23 1351  BP: 111/61  Pulse: 90  Weight: 163 lb 11.2 oz (74.3 kg)    Fetal Status: Fetal Heart Rate (bpm): 136   Movement: Present     General:  Alert, oriented and cooperative. Nichole Soto is in no acute distress.  Skin: Skin is warm and dry. No rash noted.   Cardiovascular: Normal heart rate noted  Respiratory: Normal respiratory effort, no problems with respiration noted  Abdomen: Soft, gravid, appropriate for gestational age. Pain/Pressure: Present     Pelvic: Vag. Bleeding: None     Cervical exam deferred        Extremities: Normal range of motion.  Edema: None  Mental Status: Normal mood and affect. Normal behavior. Normal judgment and thought content.   Urinalysis:      Assessment and Plan:  Pregnancy: [id number] at [redacted]w[redacted]d  1. Supervision of high risk pregnancy, antepartum BP and FHR normal No visits since initial OB at 24 weeks Not fasting today and does not have  time to stay for labs, will schedule lab appt Offered TDAP and RSV vaccine, accepts, given letter for [facility] (self pay)  2. Language barrier Spanish  3. Group B streptococcal bacteriuria Ppx in labor  4. Gallstones No issues recently  5. 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 History of cesarean delivery, currently pregnant CS for malpresentation>IUFD delivered vaginally>CS in setting of vaginal bleeding/concern for abruption and breech presentation at 35 weeks Discussed TOLAC vs RCS, incorporating fact that baby is LGA After reviewing risks and benefits Nichole Soto elects for RCS Message sent to schedule VBAC form completed  7. History of molar pregnancy 10/2021, hcg trended to 0 Placenta to pathology after delivery  8. History of preterm premature rupture of membranes (PPROM) IUFD at 20 weeks  9. Obesity affecting pregnancy in third trimester, unspecified obesity type   10. Excessive fetal growth affecting management of pregnancy in third trimester, single or unspecified fetus Last growth [place] 07/20/23, EFW 2800g, >99%, AC >99%  Preterm labor symptoms and general obstetric precautions including but not limited to vaginal bleeding, contractions, leaking of fluid and fetal movement were reviewed in detail with the Nichole Soto. Please refer to After Visit Summary for other counseling recommendations.  No follow-ups on file.   Venora Maples, MD

## 2023-07-28 ENCOUNTER — Other Ambulatory Visit: Payer: Self-pay

## 2023-07-28 DIAGNOSIS — O0990 Supervision of high risk pregnancy, unspecified, unspecified trimester: Secondary | ICD-10-CM

## 2023-07-28 LAB — HEMOGLOBIN A1C
Est. average glucose Bld gHb Est-mCnc: 117 mg/dL
Hgb A1c MFr Bld: 5.7 % — ABNORMAL HIGH (ref 4.8–5.6)

## 2023-07-29 LAB — GLUCOSE TOLERANCE, 2 HOURS W/ 1HR
Glucose, 1 hour: 146 mg/dL (ref 70–179)
Glucose, 2 hour: 133 mg/dL (ref 70–152)
Glucose, Fasting: 74 mg/dL (ref 70–91)

## 2023-07-29 LAB — CBC
Hematocrit: 31.6 % — ABNORMAL LOW (ref 34.0–46.6)
Hemoglobin: 10 g/dL — ABNORMAL LOW (ref 11.1–15.9)
MCH: 26.2 pg — ABNORMAL LOW (ref 26.6–33.0)
MCHC: 31.6 g/dL (ref 31.5–35.7)
MCV: 83 fL (ref 79–97)
Platelets: 223 10*3/uL (ref 150–450)
RBC: 3.82 x10E6/uL (ref 3.77–5.28)
RDW: 14.3 % (ref 11.7–15.4)
WBC: 7.9 10*3/uL (ref 3.4–10.8)

## 2023-07-29 LAB — RPR: RPR Ser Ql: NONREACTIVE

## 2023-07-29 LAB — HIV ANTIBODY (ROUTINE TESTING W REFLEX): HIV Screen 4th Generation wRfx: NONREACTIVE

## 2023-07-31 ENCOUNTER — Telehealth: Payer: Self-pay | Admitting: General Practice

## 2023-07-31 ENCOUNTER — Other Ambulatory Visit (HOSPITAL_COMMUNITY): Payer: Self-pay

## 2023-07-31 ENCOUNTER — Other Ambulatory Visit: Payer: Self-pay | Admitting: Family Medicine

## 2023-07-31 ENCOUNTER — Encounter: Payer: Self-pay | Admitting: Family Medicine

## 2023-07-31 DIAGNOSIS — O99013 Anemia complicating pregnancy, third trimester: Secondary | ICD-10-CM

## 2023-07-31 MED ORDER — FERROUS SULFATE 325 (65 FE) MG PO TBEC
325.0000 mg | DELAYED_RELEASE_TABLET | ORAL | 2 refills | Status: DC
Start: 2023-07-31 — End: 2023-07-31
  Filled 2023-07-31: qty 30, 60d supply, fill #0

## 2023-07-31 MED ORDER — FERROUS SULFATE 325 (65 FE) MG PO TBEC
325.0000 mg | DELAYED_RELEASE_TABLET | ORAL | 2 refills | Status: AC
Start: 2023-07-31 — End: 2024-01-27

## 2023-07-31 NOTE — Telephone Encounter (Signed)
-----   Message from Venora Maples sent at 07/31/2023 12:11 PM EDT ----- Abnormal A1c but normal 2hr GTT, does not have GDM Mild anemia, will send PO iron for every other day. No pharmacy on file so I sent it to the [facility] outpatient pharmacy Please notify patient (does not have mychart) of overall normal results except for anemia with need to take PO iron

## 2023-07-31 NOTE — Telephone Encounter (Signed)
Called patient with Nichole Soto assisting with spanish interpretation & informed her of results. Iron prescription sent to patient's local pharmacy. Patient verbalized understanding.

## 2023-08-18 ENCOUNTER — Encounter: Payer: Self-pay | Admitting: Obstetrics and Gynecology

## 2023-08-22 NOTE — Patient Instructions (Signed)
Instrucciones Para Antes de la Ciruga   Su ciruga est programada para 09/02/2023  (your procedure is scheduled on) Entre por la entrada principal del [facility]  a las 0730 de la [employer] -(enter through the main entrance at [facility] at [employer] AM    [employer] [place], [place] [address] para informarnos de su llegada. (pick up phone, dial [phone] on arrival)     Por favor llame al [phone] si tiene algn problema en la maana de la ciruga (please call this number if you have any problems the morning of surgery.)                  Recuerde: (Remember)  No coma alimentos. (Do not eat food (After Midnight) Desps de medianoche)    No tome lquidos claros. (Do not drink clear liquids (After Midnight) Desps de medianoche)    No use joyas, maquillaje de ojos, lpiz labial, crema para el cuerpo o esmalte de uas oscuro. (Do not wear jewelry, eye makeup, lipstick, body lotion, or dark fingernail polish). Puede usar desodorante (you may wear deodorant)    No se afeite 48 horas de su ciruga. (Do not shave 48 hours before your surgery)    No traiga objetos de valor al hospital.  [facility] no se hace responsable de ninguna pertenencia, ni objetos de valor que haya trado al hospital. (Do not bring valuable to the hospital.  [facility] is not responsible for any belongings or valuables brought to the hospital)   [facility] medicinas en la maana de la ciruga con un SORBITO de agua nada (take these meds the morning of surgery with a SIP of water)     Durante la ciruga no se pueden usar lentes de contacto, dentaduras o puentes. (Contacts, dentures or bridgework cannot be worn in surgery).   Si va a ser ingresado despus de la ciruga, deje la [employer] en el carro hasta que se le haya asignado una habitacin. (If you are to be admitted after surgery, leave suitcase in car until your room has been assigned.)   A los pacientes que se les d de alta  el mismo da no se les permitir manejar a casa.  (Patients discharged on the day of surgery will not be allowed to drive home)    [place] y nmero de telfono del [employer], multimedia NA. (Name and telephone number of your driver)   Instrucciones especiales Shower using CHG 2 nights before surgery and the night before surgery.  If you shower the day of surgery use CHG.  Use special wash - you have one bottle of CHG for all showers.  You should use approximately 1/3 of the bottle for each shower. (Special Instructions)   Por favor, lea las hojas informativas que le entregaron. (Please read over the following fact sheets that you were given) Surgical Site Infection Prevention

## 2023-08-24 ENCOUNTER — Encounter (HOSPITAL_COMMUNITY): Payer: Self-pay

## 2023-08-24 ENCOUNTER — Ambulatory Visit: Payer: Self-pay | Attending: Obstetrics

## 2023-08-24 DIAGNOSIS — O3663X0 Maternal care for excessive fetal growth, third trimester, not applicable or unspecified: Secondary | ICD-10-CM | POA: Insufficient documentation

## 2023-08-24 DIAGNOSIS — Z3A37 37 weeks gestation of pregnancy: Secondary | ICD-10-CM

## 2023-08-24 DIAGNOSIS — O99013 Anemia complicating pregnancy, third trimester: Secondary | ICD-10-CM

## 2023-08-24 DIAGNOSIS — O34219 Maternal care for unspecified type scar from previous cesarean delivery: Secondary | ICD-10-CM | POA: Insufficient documentation

## 2023-08-24 DIAGNOSIS — O0933 Supervision of pregnancy with insufficient antenatal care, third trimester: Secondary | ICD-10-CM

## 2023-08-24 DIAGNOSIS — O09293 Supervision of pregnancy with other poor reproductive or obstetric history, third trimester: Secondary | ICD-10-CM

## 2023-08-24 DIAGNOSIS — O09213 Supervision of pregnancy with history of pre-term labor, third trimester: Secondary | ICD-10-CM

## 2023-08-24 DIAGNOSIS — D649 Anemia, unspecified: Secondary | ICD-10-CM

## 2023-08-24 DIAGNOSIS — O99213 Obesity complicating pregnancy, third trimester: Secondary | ICD-10-CM | POA: Insufficient documentation

## 2023-08-26 ENCOUNTER — Inpatient Hospital Stay (HOSPITAL_COMMUNITY): Payer: Medicaid Other | Admitting: Anesthesiology

## 2023-08-26 ENCOUNTER — Inpatient Hospital Stay (HOSPITAL_COMMUNITY)
Admission: AD | Admit: 2023-08-26 | Discharge: 2023-08-29 | DRG: 787 | Disposition: A | Discharge status: Home / Self Care | Payer: Medicaid Other | Attending: Obstetrics and Gynecology | Admitting: Obstetrics and Gynecology

## 2023-08-26 ENCOUNTER — Encounter (HOSPITAL_COMMUNITY): Payer: Self-pay | Admitting: Obstetrics and Gynecology

## 2023-08-26 ENCOUNTER — Encounter (HOSPITAL_COMMUNITY)
Admission: AD | Disposition: A | Discharge status: Home / Self Care | Payer: Self-pay | Source: Home / Self Care | Attending: Obstetrics and Gynecology

## 2023-08-26 DIAGNOSIS — O26893 Other specified pregnancy related conditions, third trimester: Secondary | ICD-10-CM | POA: Diagnosis present

## 2023-08-26 DIAGNOSIS — O34211 Maternal care for low transverse scar from previous cesarean delivery: Secondary | ICD-10-CM | POA: Diagnosis present

## 2023-08-26 DIAGNOSIS — O3663X0 Maternal care for excessive fetal growth, third trimester, not applicable or unspecified: Principal | ICD-10-CM | POA: Diagnosis present

## 2023-08-26 DIAGNOSIS — O9081 Anemia of the puerperium: Secondary | ICD-10-CM | POA: Diagnosis not present

## 2023-08-26 DIAGNOSIS — Z603 Acculturation difficulty: Secondary | ICD-10-CM | POA: Diagnosis present

## 2023-08-26 DIAGNOSIS — O99824 Streptococcus B carrier state complicating childbirth: Secondary | ICD-10-CM | POA: Diagnosis present

## 2023-08-26 DIAGNOSIS — Z3A38 38 weeks gestation of pregnancy: Secondary | ICD-10-CM

## 2023-08-26 DIAGNOSIS — O3403 Maternal care for unspecified congenital malformation of uterus, third trimester: Secondary | ICD-10-CM | POA: Diagnosis present

## 2023-08-26 DIAGNOSIS — O99214 Obesity complicating childbirth: Secondary | ICD-10-CM | POA: Diagnosis present

## 2023-08-26 DIAGNOSIS — D62 Acute posthemorrhagic anemia: Secondary | ICD-10-CM | POA: Diagnosis not present

## 2023-08-26 DIAGNOSIS — Z3490 Encounter for supervision of normal pregnancy, unspecified, unspecified trimester: Principal | ICD-10-CM

## 2023-08-26 DIAGNOSIS — Q513 Bicornate uterus: Secondary | ICD-10-CM | POA: Diagnosis not present

## 2023-08-26 DIAGNOSIS — O99820 Streptococcus B carrier state complicating pregnancy: Secondary | ICD-10-CM | POA: Diagnosis not present

## 2023-08-26 LAB — URINALYSIS, ROUTINE W REFLEX MICROSCOPIC
Bilirubin Urine: NEGATIVE
Glucose, UA: NEGATIVE mg/dL
Hgb urine dipstick: NEGATIVE
Ketones, ur: NEGATIVE mg/dL
Leukocytes,Ua: NEGATIVE
Nitrite: NEGATIVE
Protein, ur: NEGATIVE mg/dL
Specific Gravity, Urine: 1.017 (ref 1.005–1.030)
pH: 6 (ref 5.0–8.0)

## 2023-08-26 LAB — CBC
HCT: 33 % — ABNORMAL LOW (ref 36.0–46.0)
Hemoglobin: 10.5 g/dL — ABNORMAL LOW (ref 12.0–15.0)
MCH: 25.7 pg — ABNORMAL LOW (ref 26.0–34.0)
MCHC: 31.8 g/dL (ref 30.0–36.0)
MCV: 80.9 fL (ref 80.0–100.0)
Platelets: 244 10*3/uL (ref 150–400)
RBC: 4.08 MIL/uL (ref 3.87–5.11)
RDW: 15.3 % (ref 11.5–15.5)
WBC: 6.4 10*3/uL (ref 4.0–10.5)
nRBC: 0.5 % — ABNORMAL HIGH (ref 0.0–0.2)

## 2023-08-26 SURGERY — Surgical Case
Anesthesia: Spinal

## 2023-08-26 MED ORDER — BUPIVACAINE HCL (PF) 0.25 % IJ SOLN
INTRAMUSCULAR | Status: AC
  Filled 2023-08-26: qty 10

## 2023-08-26 MED ORDER — BUPIVACAINE IN DEXTROSE 0.75-8.25 % IT SOLN
INTRATHECAL | Status: DC | PRN
  Administered 2023-08-26: 1.6 mL via INTRATHECAL

## 2023-08-26 MED ORDER — MEPERIDINE HCL 25 MG/ML IJ SOLN: 6.2500 mg | INTRAMUSCULAR | Status: DC | PRN

## 2023-08-26 MED ORDER — OXYCODONE HCL 5 MG/5ML PO SOLN: 5.0000 mg | Freq: Once | ORAL | Status: DC | PRN

## 2023-08-26 MED ORDER — DEXAMETHASONE SODIUM PHOSPHATE 10 MG/ML IJ SOLN
INTRAMUSCULAR | Status: AC
  Filled 2023-08-26: qty 1

## 2023-08-26 MED ORDER — NALOXONE HCL 4 MG/10ML IJ SOLN: 1.0000 ug/kg/h | INTRAVENOUS | Status: DC | PRN

## 2023-08-26 MED ORDER — MORPHINE SULFATE (PF) 0.5 MG/ML IJ SOLN
INTRAMUSCULAR | Status: AC
  Filled 2023-08-26: qty 10

## 2023-08-26 MED ORDER — LACTATED RINGERS IV SOLN
INTRAVENOUS | Status: DC | PRN
Start: 2023-08-26 — End: 2023-08-26

## 2023-08-26 MED ORDER — OXYTOCIN-SODIUM CHLORIDE 30-0.9 UT/500ML-% IV SOLN
INTRAVENOUS | Status: DC | PRN
  Administered 2023-08-26: 999 mL/h via INTRAVENOUS

## 2023-08-26 MED ORDER — FENTANYL CITRATE (PF) 250 MCG/5ML IJ SOLN
INTRAMUSCULAR | Status: DC | PRN
Start: 2023-08-26 — End: 2023-08-26
  Administered 2023-08-26: 15 ug via INTRATHECAL

## 2023-08-26 MED ORDER — MORPHINE SULFATE (PF) 0.5 MG/ML IJ SOLN
INTRAMUSCULAR | Status: DC | PRN
  Administered 2023-08-26: 150 ug via INTRATHECAL

## 2023-08-26 MED ORDER — SODIUM CHLORIDE 0.9 % IR SOLN
Status: DC | PRN
  Administered 2023-08-26: 1

## 2023-08-26 MED ORDER — ONDANSETRON HCL 4 MG/2ML IJ SOLN
INTRAMUSCULAR | Status: DC | PRN
  Administered 2023-08-26: 4 mg via INTRAVENOUS

## 2023-08-26 MED ORDER — ACETAMINOPHEN 10 MG/ML IV SOLN
INTRAVENOUS | Status: AC
  Filled 2023-08-26: qty 100

## 2023-08-26 MED ORDER — SODIUM CHLORIDE 0.9 % IV SOLN
500.0000 mg | INTRAVENOUS | Status: AC
  Administered 2023-08-26: 500 mg via INTRAVENOUS

## 2023-08-26 MED ORDER — TRANEXAMIC ACID-NACL 1000-0.7 MG/100ML-% IV SOLN
INTRAVENOUS | Status: AC
  Filled 2023-08-26: qty 100

## 2023-08-26 MED ORDER — SODIUM CHLORIDE 0.9 % IV SOLN: 12.5000 mg | INTRAVENOUS | Status: DC | PRN

## 2023-08-26 MED ORDER — FAMOTIDINE IN NACL 20-0.9 MG/50ML-% IV SOLN
20.0000 mg | Freq: Once | INTRAVENOUS | Status: AC
  Administered 2023-08-26: 20 mg via INTRAVENOUS
  Filled 2023-08-26: qty 50

## 2023-08-26 MED ORDER — NALOXONE HCL 0.4 MG/ML IJ SOLN: 0.4000 mg | INTRAMUSCULAR | Status: DC | PRN

## 2023-08-26 MED ORDER — ONDANSETRON HCL 4 MG/2ML IJ SOLN
INTRAMUSCULAR | Status: AC
  Filled 2023-08-26: qty 2

## 2023-08-26 MED ORDER — FENTANYL CITRATE (PF) 100 MCG/2ML IJ SOLN
INTRAMUSCULAR | Status: AC
  Filled 2023-08-26: qty 2

## 2023-08-26 MED ORDER — CEFAZOLIN SODIUM-DEXTROSE 2-4 GM/100ML-% IV SOLN
2.0000 g | INTRAVENOUS | Status: AC
  Administered 2023-08-26: 2 g via INTRAVENOUS
  Filled 2023-08-26: qty 100

## 2023-08-26 MED ORDER — DEXAMETHASONE SODIUM PHOSPHATE 10 MG/ML IJ SOLN
INTRAMUSCULAR | Status: DC | PRN
Start: 2023-08-26 — End: 2023-08-26
  Administered 2023-08-26: 10 mg via INTRAVENOUS

## 2023-08-26 MED ORDER — KETOROLAC TROMETHAMINE 30 MG/ML IJ SOLN
30.0000 mg | Freq: Once | INTRAMUSCULAR | Status: AC | PRN
  Administered 2023-08-26: 30 mg via INTRAVENOUS

## 2023-08-26 MED ORDER — SODIUM CHLORIDE 0.9% FLUSH: 3.0000 mL | INTRAVENOUS | Status: DC | PRN

## 2023-08-26 MED ORDER — BUPIVACAINE HCL (PF) 0.25 % IJ SOLN
INTRAMUSCULAR | Status: AC
  Filled 2023-08-26: qty 20

## 2023-08-26 MED ORDER — OXYTOCIN-SODIUM CHLORIDE 30-0.9 UT/500ML-% IV SOLN
INTRAVENOUS | Status: AC
  Filled 2023-08-26: qty 500

## 2023-08-26 MED ORDER — EPHEDRINE 5 MG/ML INJ
INTRAVENOUS | Status: AC
  Filled 2023-08-26: qty 5

## 2023-08-26 MED ORDER — HYDROMORPHONE HCL 1 MG/ML IJ SOLN: 0.2500 mg | INTRAMUSCULAR | Status: DC | PRN

## 2023-08-26 MED ORDER — ACETAMINOPHEN 10 MG/ML IV SOLN
INTRAVENOUS | Status: DC | PRN
Start: 2023-08-26 — End: 2023-08-26
  Administered 2023-08-26: 1000 mg via INTRAVENOUS

## 2023-08-26 MED ORDER — SOD CITRATE-CITRIC ACID 500-334 MG/5ML PO SOLN: 30.0000 mL | ORAL | Status: DC

## 2023-08-26 MED ORDER — FENTANYL CITRATE (PF) 100 MCG/2ML IJ SOLN: 50.0000 ug | INTRAMUSCULAR | Status: DC | PRN

## 2023-08-26 MED ORDER — SOD CITRATE-CITRIC ACID 500-334 MG/5ML PO SOLN
30.0000 mL | Freq: Once | ORAL | Status: DC
  Filled 2023-08-26: qty 30

## 2023-08-26 MED ORDER — LACTATED RINGERS IV SOLN: INTRAVENOUS | Status: AC

## 2023-08-26 MED ORDER — DIPHENHYDRAMINE HCL 50 MG/ML IJ SOLN: 12.5000 mg | INTRAMUSCULAR | Status: DC | PRN

## 2023-08-26 MED ORDER — PHENYLEPHRINE HCL-NACL 20-0.9 MG/250ML-% IV SOLN
INTRAVENOUS | Status: DC | PRN
  Administered 2023-08-26: 60 ug/min via INTRAVENOUS

## 2023-08-26 MED ORDER — PHENYLEPHRINE HCL-NACL 20-0.9 MG/250ML-% IV SOLN
INTRAVENOUS | Status: AC
  Filled 2023-08-26: qty 250

## 2023-08-26 MED ORDER — DIPHENHYDRAMINE HCL 25 MG PO CAPS: 25.0000 mg | ORAL_CAPSULE | ORAL | Status: DC | PRN

## 2023-08-26 MED ORDER — METOCLOPRAMIDE HCL 5 MG/ML IJ SOLN
INTRAMUSCULAR | Status: DC | PRN
Start: 2023-08-26 — End: 2023-08-26
  Administered 2023-08-26: 10 mg via INTRAVENOUS

## 2023-08-26 MED ORDER — STERILE WATER FOR IRRIGATION IR SOLN
Status: DC | PRN
  Administered 2023-08-26: 1000 mL

## 2023-08-26 MED ORDER — LACTATED RINGERS IV BOLUS
1000.0000 mL | Freq: Once | INTRAVENOUS | Status: AC
  Administered 2023-08-26: 1000 mL via INTRAVENOUS

## 2023-08-26 MED ORDER — OXYCODONE HCL 5 MG PO TABS: 5.0000 mg | ORAL_TABLET | Freq: Once | ORAL | Status: DC | PRN

## 2023-08-26 MED ORDER — KETOROLAC TROMETHAMINE 30 MG/ML IJ SOLN
INTRAMUSCULAR | Status: AC
  Filled 2023-08-26: qty 1

## 2023-08-26 SURGICAL SUPPLY — 31 items
APL PRP STRL LF DISP 70% ISPRP (MISCELLANEOUS) ×2
APL SKNCLS STERI-STRIP NONHPOA (GAUZE/BANDAGES/DRESSINGS)
BENZOIN TINCTURE PRP APPL 2/3 (GAUZE/BANDAGES/DRESSINGS) IMPLANT
CHLORAPREP W/TINT 26 (MISCELLANEOUS) ×2 IMPLANT
CLAMP UMBILICAL CORD (MISCELLANEOUS) ×1 IMPLANT
CLOTH BEACON ORANGE TIMEOUT ST (SAFETY) ×1 IMPLANT
DRSG OPSITE POSTOP 4X10 (GAUZE/BANDAGES/DRESSINGS) ×1 IMPLANT
ELECT REM PT RETURN 9FT ADLT (ELECTROSURGICAL) ×1
ELECTRODE REM PT RTRN 9FT ADLT (ELECTROSURGICAL) ×1 IMPLANT
EXTRACTOR VACUUM BELL STYLE (SUCTIONS) IMPLANT
GLOVE BIOGEL PI IND STRL 6.5 (GLOVE) ×2 IMPLANT
GLOVE ECLIPSE 6.5 STRL STRAW (GLOVE) ×2 IMPLANT
GOWN STRL REUS W/TWL LRG LVL3 (GOWN DISPOSABLE) ×3 IMPLANT
KIT ABG SYR 3ML LUER SLIP (SYRINGE) IMPLANT
NDL HYPO 25X1 1.5 SAFETY (NEEDLE) IMPLANT
NEEDLE HYPO 22GX1.5 SAFETY (NEEDLE) ×1 IMPLANT
NEEDLE HYPO 25X1 1.5 SAFETY (NEEDLE) IMPLANT
NS IRRIG 1000ML POUR BTL (IV SOLUTION) ×1 IMPLANT
PACK C SECTION WH (CUSTOM PROCEDURE TRAY) ×1 IMPLANT
PAD OB MATERNITY 4.3X12.25 (PERSONAL CARE ITEMS) ×1 IMPLANT
STRIP CLOSURE SKIN 1/2X4 (GAUZE/BANDAGES/DRESSINGS) IMPLANT
SUT MON AB 4-0 PS1 27 (SUTURE) ×1 IMPLANT
SUT PLAIN 2 0 (SUTURE) ×1
SUT PLAIN ABS 2-0 CT1 27XMFL (SUTURE) ×1 IMPLANT
SUT VIC AB 0 CT1 36 (SUTURE) ×2 IMPLANT
SUT VIC AB 0 CTX 36 (SUTURE) ×1
SUT VIC AB 0 CTX36XBRD ANBCTRL (SUTURE) ×1 IMPLANT
SYR CONTROL 10ML LL (SYRINGE) ×1 IMPLANT
TOWEL OR 17X24 6PK STRL BLUE (TOWEL DISPOSABLE) ×1 IMPLANT
TRAY FOLEY W/BAG SLVR 14FR LF (SET/KITS/TRAYS/PACK) ×1 IMPLANT
WATER STERILE IRR 1000ML POUR (IV SOLUTION) ×1 IMPLANT

## 2023-08-26 NOTE — Anesthesia Procedure Notes (Signed)
Spinal  Patient location during procedure: OB Start time: 08/26/2023 9:15 PM End time: 08/26/2023 9:20 PM Reason for block: surgical anesthesia Staffing Performed: anesthesiologist  Anesthesiologist: Lowella Curb, MD Performed by: Lowella Curb, MD Authorized by: Lowella Curb, MD   Preanesthetic Checklist Completed: patient identified, IV checked, risks and benefits discussed, surgical consent, monitors and equipment checked, pre-op evaluation and timeout performed Spinal Block Patient position: sitting Prep: DuraPrep and site prepped and draped Patient monitoring: heart rate, cardiac monitor, continuous pulse ox and blood pressure Approach: midline Location: L3-4 Injection technique: single-shot Needle Needle type: Pencan  Needle gauge: 24 G Needle length: 10 cm Assessment Sensory level: T4 Events: CSF return

## 2023-08-26 NOTE — Anesthesia Postprocedure Evaluation (Signed)
Anesthesia Post Note  Patient: Nichole Soto  Procedure(s) Performed: CESAREAN SECTION     Patient location during evaluation: PACU Anesthesia Type: Spinal Level of consciousness: awake and alert Pain management: pain level controlled Vital Signs Assessment: post-procedure vital signs reviewed and stable Respiratory status: spontaneous breathing, nonlabored ventilation and respiratory function stable Cardiovascular status: blood pressure returned to baseline and stable Postop Assessment: no apparent nausea or vomiting Anesthetic complications: no   There were no known notable events for this encounter.  Last Vitals:  Vitals:   08/26/23 2315 08/26/23 2329  BP: (!) 99/44 105/68  Pulse: 77 73  Resp: 18 15  Temp:  36.6 C  SpO2:      Last Pain:  Vitals:   08/26/23 2329  TempSrc: Oral  PainSc:    Pain Goal:    LLE Motor Response: Purposeful movement (08/26/23 2315) LLE Sensation: Tingling (08/26/23 2315) RLE Motor Response: Purposeful movement (08/26/23 2315) RLE Sensation: Tingling (08/26/23 2315)     Epidural/Spinal Function Cutaneous sensation: Able to Wiggle Toes (08/26/23 2315), Patient able to flex knees: Yes (08/26/23 2315), Patient able to lift hips off bed: No (08/26/23 2315), Back pain beyond tenderness at insertion site: No (08/26/23 2315), Progressively worsening motor and/or sensory loss: No (08/26/23 2315), Bowel and/or bladder incontinence post epidural: No (08/26/23 2315)  Lowella Curb

## 2023-08-26 NOTE — Anesthesia Preprocedure Evaluation (Signed)
Anesthesia Evaluation  Patient identified by MRN, date of birth, ID band Patient awake    Reviewed: Allergy & Precautions, NPO status , Patient's Chart, lab work & pertinent test results  Airway Mallampati: III  TM Distance: >3 FB Neck ROM: Full  Mouth opening: Limited Mouth Opening  Dental  (+) Teeth Intact, Dental Advisory Given Multiple silver-capped teeth:   Pulmonary neg pulmonary ROS   Pulmonary exam normal breath sounds clear to auscultation       Cardiovascular negative cardio ROS Normal cardiovascular exam Rhythm:Regular Rate:Normal     Neuro/Psych  Headaches  negative psych ROS   GI/Hepatic negative GI ROS, Neg liver ROS,,,  Endo/Other  Obesity BMI 36  Renal/GU negative Renal ROS  negative genitourinary   Musculoskeletal negative musculoskeletal ROS (+)    Abdominal   Peds negative pediatric ROS (+)  Hematology  (+) Blood dyscrasia, anemia hct 32.2, plt 217   Anesthesia Other Findings   Reproductive/Obstetrics (+) Pregnancy G3P1, 1 prior section 2020 Presented to MAU w/ slow abruption, 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                               Anesthesia Physical Anesthesia Plan  ASA: III and emergent  Anesthesia Plan: Spinal   Post-op Pain Management:    Induction:   PONV Risk Score and Plan: 3 and Ondansetron, Dexamethasone and Treatment may vary due to age or medical condition  Airway Management Planned: Natural Airway and Nasal Cannula  Additional Equipment: None  Intra-op Plan:   Post-operative Plan:   Informed Consent: I have reviewed the patients History and Physical, chart, labs and discussed the procedure including the risks, benefits and alternatives for the proposed anesthesia with the patient or authorized representative who has indicated his/her understanding and acceptance.       Plan Discussed with: CRNA  Anesthesia Plan Comments:           Anesthesia Quick Evaluation

## 2023-08-26 NOTE — Op Note (Signed)
Jaylina P [name] de Pascual PROCEDURE DATE: 08/26/2023  PREOPERATIVE DIAGNOSES: Intrauterine pregnancy at [redacted]w[redacted]d weeks gestation;  elective RLTCS given LGA in s/o H/o 2 prior CS (breech)  POSTOPERATIVE DIAGNOSES: The same, viable infant delivered  PROCEDURE: RepeatLow Transverse Cesarean Section  SURGEON:  Dr. Jonette Eva   ASSISTANT:  Hessie Dibble, MD An experienced assistant was required given the standard of surgical care given the complexity of the case.  This assistant was needed for exposure, dissection, suctioning, retraction, instrument exchange, assisting with delivery with administration of fundal pressure, and for overall help during the procedure.  ANESTHESIOLOGY TEAM: Anesthesiologist: Lowella Curb, MD CRNA: Rica Records, CRNA  INDICATIONS: Nichole Soto is a 32 y.o. [phone] at [redacted]w[redacted]d here for cesarean section secondary to the indications listed under preoperative diagnoses; please see preoperative note for further details.  The risks of surgery were discussed with the patient including but were not limited to: bleeding which may require transfusion or reoperation; infection which may require antibiotics; injury to bowel, bladder, ureters or other surrounding organs; injury to the fetus; need for additional procedures including hysterectomy in the event of a life-threatening hemorrhage; formation of adhesions; placental abnormalities wth subsequent pregnancies; incisional problems; thromboembolic phenomenon and other postoperative/anesthesia complications.  The patient concurred with the proposed plan, giving informed written consent for the procedure.    FINDINGS:  Viable female infant in cephalic presentation.  Apgars 8 and 9.  Amniotic fluid: clear.  Intact placenta, three vessel cord.  Normal uterus, fallopian tubes and ovaries bilaterally.  ANESTHESIA: spinal INTRAVENOUS FLUIDS: 1000 ml LR   ESTIMATED BLOOD LOSS: 469 ml URINE OUTPUT:  150 ml  clear  SPECIMENS: Placenta sent to pathology . COMPLICATIONS: None immediate  PROCEDURE IN DETAIL:  The patient preoperatively received intravenous antibiotics and had sequential compression devices applied to her lower extremities.  She was then taken to the operating room where spinal anesthesia was found to be adequate. She was then placed in a dorsal supine position with a leftward tilt, and prepped and draped in a sterile manner.  A foley catheter was  placed into her bladder and attached to constant gravity.  After an adequate timeout was performed, a Pfannenstiel skin incision was made with scalpel and carried through to the underlying layer of fascia. The fascia was incised in the midline, and this incision was extended bluntly initially but did require sharp dissecting with mayos at the edge. We also needed to dissect approximately 2cm of L midline rectus to make appropriate space to enter midline. The rectus muscles were separated in the midline and the peritoneum was entered bluntly.   The Alexis self-retaining retractor was introduced into the abdominal cavity.  Attention was turned to the lower uterine segment where a low transverse hysterotomy was made with a scalpel and extended bluntly in caudad and cephalad directions.  The infant was successfully delivered, the cord was clamped and cut after  one minute, and the infant was handed over to the awaiting neonatology team. Uterine massage was then administered, and the placenta delivered intact with a three-vessel cord. The uterus was then cleared of clots and debris.  The hysterotomy was closed with 0-Monocryl in a running fashion.  Three different bovie cauterization locations placed to help with hemostasis.    The pelvis was cleared of all clot and debris. Hemostasis was confirmed on all surfaces. The uterus incision was once again inspected and found to be hemostatic. The retractor was removed.  The  peritoneum was closed with a 2-0  Monocryl running stitch. The fascia was then closed using 0 Vicryl in a running fashion.  The subcutaneous layer was irrigated, any areas of bleeding were cauterized with the bovie,  was found to be hemostatic.. Subcutaneous tissue infused with 30cc 0.25% Marcaine. The skin was closed with a 4-0 Monocryl subcuticular stitch. The patient tolerated the procedure well. Sponge, instrument and needle counts were correct x 3.  She was taken to the recovery room in stable condition.   Hessie Dibble, MD FMOB Fellow, Faculty practice [facility], Center for [facility] 08/26/23  10:26 PM

## 2023-08-26 NOTE — Transfer of Care (Signed)
Immediate Anesthesia Transfer of Care Note  Patient: Nichole Soto  Procedure(s) Performed: CESAREAN SECTION  Patient Location: PACU  Anesthesia Type:Spinal  Level of Consciousness: awake, alert , and oriented  Airway & Oxygen Therapy: Patient Spontanous Breathing  Post-op Assessment: Report given to RN and Post -op Vital signs reviewed and stable  Post vital signs: Reviewed and stable  Last Vitals:  Vitals Value Taken Time  BP 100/49 08/26/23 2235  Temp    Pulse 92 08/26/23 2236  Resp 20 08/26/23 2236  SpO2 98 % 08/26/23 2236  Vitals shown include unfiled device data.  Last Pain:  Vitals:   08/26/23 1912  TempSrc:   PainSc: 10-Worst pain ever         Complications: No notable events documented.

## 2023-08-26 NOTE — H&P (Signed)
OBSTETRIC ADMISSION HISTORY AND PHYSICAL  Nichole Soto is a 32 y.o. female [phone] with IUP at [redacted]w[redacted]d by LMP presenting with labor, with contractions Q3-5 minutes. She reports +FMs, No LOF, no VB, no blurry vision, headaches or peripheral edema, and RUQ pain.  She plans on breast and formula feeding. She request Depo for birth control.  She received her prenatal care at [facility]   Dating: By LMP --->  Estimated Date of Delivery: 09/09/23  Sono:    10/10, cephalic presentation, 4584g, >45% EFW, Afi 21. AC >99%ile,    Prenatal History/Complications:  H/o molar pregnancy  - send placenta to pathology following delivery LGA - see above. GTT wnl.  H/o two prior c-sections - due to fetal malpresentation (breech). She was considering tolac but with LGA, she ultimately decided she preferred RLTCS.  Bicornuate uterus Anemia of pregnancy Language barrier  Past Medical History: 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   History of molar pregnancy 11/02/2021   2022: d&c>>consistent with molar pregnancy in pathology but type not specified; likely complete given initial hcg # and u/s findings  For patient's molar pregnancy, continue weekly labs for hCG levels until three consecutive normal values are obtained.  A plateau or rise in hCG may be indicative of the development of persistent trophoblastic disease, and necessitates further evaluation and treatme   History of premature rupture of membranes in previous pregnancy, currently pregnant 07/16/2020   Hx of cesarean section 07/30/2019   [place] Never told she couldn't labor. For malpresentation.   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 early first trimester    Past Surgical History: Past Surgical History:  Procedure Laterality Date   CESAREAN SECTION     CESAREAN SECTION  12/12/2020   Procedure: CESAREAN SECTION;  Surgeon: Warden Fillers, MD;  Location: [facility] LD ORS;  Service: Obstetrics;;   DILATION AND EVACUATION N/A 11/04/2021   Procedure: DILATATION AND EVACUATION;  Surgeon: Catalina Antigua, MD;  Location: [facility] OR;  Service: Gynecology;  Laterality: N/A;   GALLBLADDER SURGERY  2022   OPERATIVE ULTRASOUND N/A 11/04/2021   Procedure: OPERATIVE ULTRASOUND;  Surgeon: Catalina Antigua, MD;  Location: [facility] OR;  Service: Gynecology;  Laterality: N/A;    Obstetrical History: OB History     Gravida  5   Para  3   Term  1   Preterm  1   AB  1   Living  2      SAB  0   IAB  0   Ectopic  0   Multiple  0   Live Births  3        Obstetric Comments  G1: 2012, term c/s for transverse. Pt never told she couldn't labor in the future.          Social History Social History   Socioeconomic History   Marital status: Significant Other    Spouse name: Not on file   Number of children: Not on file   Years of education: Not on file   Highest education level: Not on file  Occupational History   Not on  file  Tobacco Use   Smoking status: Never   Smokeless tobacco: Never  Vaping Use   Vaping status: Never Used  Substance and Sexual Activity   Alcohol use: Not Currently   Drug use: Never   Sexual activity: Yes    Birth control/protection: None  Other Topics Concern   Not on file  Social History Narrative   Not on file   Social Determinants of Health   Financial Resource Strain: Not on file  Food Insecurity: Food Insecurity Present (04/27/2023)   Hunger Vital Sign    Worried About Running Out of Food in the Last Year: Sometimes true    Ran Out of Food in the Last Year: Sometimes true  Transportation Needs: No Transportation Needs  (04/27/2023)   PRAPARE - Administrator, Civil Service (Medical): No    Lack of Transportation (Non-Medical): No  Physical Activity: Not on file  Stress: Not on file  Social Connections: Not on file    Family History: Family History  Problem Relation Age of Onset   Cancer Neg Hx    Diabetes Neg Hx    Hypertension Neg Hx     Allergies: No Known Allergies  Medications Prior to Admission  Medication Sig Dispense Refill Last Dose   aspirin EC 81 MG tablet Take 1 tablet (81 mg total) by mouth daily. Take after 12 weeks for prevention of preeclampssia later in pregnancy (Patient not taking: Reported on 08/18/2023) 300 tablet 2    ferrous sulfate 325 (65 FE) MG EC tablet Take 1 tablet (325 mg total) by mouth every other day. (Patient not taking: Reported on 08/18/2023) 30 tablet 2      Review of Systems   All systems reviewed and negative except as stated in HPI  Blood pressure 125/70, pulse 83, temperature 98.1 F (36.7 C), temperature source Oral, resp. rate 20, last menstrual period 12/03/2022, SpO2 100%, unknown if currently breastfeeding. General appearance: alert, cooperative, and mild distress with ctxns Lungs: clear to auscultation bilaterally Heart: regular rate and rhythm Abdomen: soft, non-tender; bowel sounds normal Pelvic: not examined Extremities: Homans sign is negative, no sign of DVT Presentation: cephalic by [place] on 10/10 and by RN exam.  Fetal monitoringBaseline: 140 bpm, Variability: Good {> 6 bpm), Accelerations: Reactive, and Decelerations: Absent Uterine activity: Q3-5 min Dilation: 5.5 Effacement (%): 70 Station: -1 Exam by:: Luevenia Maxin, RN   Prenatal labs: ABO, Rh: O/Positive/-- (06/13 1534) Antibody: Negative (06/13 1534) Rubella: 1.81 (06/13 1534) RPR: Non Reactive (09/13 1049)  HBsAg: Negative (06/13 1534)  HIV: Non Reactive (09/13 1049)  GBS: Positive/-- (06/04 0000)  2 hr Glucola WNL Genetic screening  LR female Anatomy US  normal  Prenatal Transfer Tool  Maternal Diabetes: No Genetic Screening: Normal Maternal Ultrasounds/Referrals: Normal Fetal Ultrasounds or other Referrals:  None Maternal Substance Abuse:  No Significant Maternal Medications:  None Significant Maternal Lab Results:  None Number of Prenatal Visits:greater than 3 verified prenatal visits Other Comments:  None  No results found for this or any previous visit (from the past 24 hour(s)).  Patient Active Problem List   Diagnosis Date Noted   Pregnancy 08/26/2023   Obesity affecting pregnancy 05/22/2023   Group B streptococcal bacteriuria 05/10/2023   History of preterm premature rupture of membranes (PPROM) 04/28/2023   History of cesarean delivery, currently pregnant 04/28/2023   Supervision of high risk pregnancy, antepartum 04/25/2023   History of molar pregnancy 11/02/2021   LGA (large for gestational age) fetus affecting management  of mother 11/04/2020   Gallstones 09/10/2020   Bicornate uterus complicating pregnancy 07/16/2020   Anemia complicating pregnancy, third trimester 08/16/2019   Language barrier 07/29/2019    Assessment/Plan:  Nichole Soto is a 32 y.o. [id number] at [redacted]w[redacted]d here for labor  #Labor: In labor, desires RLTCS.  #Pain: Fentanyl for pain while awaiting T&S #FWB: Category 1 tracing #ID:  Ancef and Azithromycin ordered #MOF: Both #MOC: Depo >> [facility] bridge to [facility]  #H/o molar pregnancy  - send placenta to pathology following delivery #LGA - see above. GTT wnl.  #H/o two prior c-sections - due to fetal malpresentation (breech). Desires RLTCS.  #Bicornuate uterus #Anemia of pregnancy - CBC pending #Language barrier - Spanish interpreter used throughout our discussion.   The risks of surgery were discussed with the patient including but were not limited to: bleeding which may require transfusion or reoperation; infection which may require antibiotics; injury to bowel, bladder,  ureters or other surrounding organs; injury to the fetus; need for additional procedures including hysterectomy in the event of a life-threatening hemorrhage; formation of adhesions; placental abnormalities with subsequent pregnancies; incisional problems; thromboembolic phenomenon and other postoperative/anesthesia complications.    The patient concurred with the proposed plan, giving informed written consent for the procedure.   Patient has been NPO since 2pm and she will remain NPO for procedure. Anesthesia and OR aware. Preoperative prophylactic antibiotics and SCDs ordered on call to the OR.  To OR when ready.   Milas Hock, MD  08/26/2023, 7:43 PM

## 2023-08-26 NOTE — Discharge Summary (Signed)
Postpartum Discharge Summary  Date of Service updated***     Patient Name: Nichole Soto DOB: 1991-03-18 MRN: [id number]  Date of admission: 08/26/2023 Delivery date:08/26/2023 Delivering provider: Reva Bores Date of discharge: 08/26/2023  Admitting diagnosis: Pregnancy [Z34.90] Intrauterine pregnancy: [redacted]w[redacted]d     Secondary diagnosis:  Principal Problem:   Pregnancy  Additional problems: ***    Discharge diagnosis: Term Pregnancy Delivered                                              Post partum procedures:{Postpartum procedures:23558} Augmentation: N/A Complications: None  Hospital course: Sceduled C/S   32 y.o. yo [id number] at [redacted]w[redacted]d was admitted to the hospital 08/26/2023 for scheduled cesarean section with the following indication:Elective Repeat, Macrosomia, and hx of 2 priors (breech), pt presented in [facility] .Delivery details are as follows:  Membrane Rupture Time/Date: 9:42 PM,08/26/2023  Delivery Method:C-Section, Low Transverse Operative Delivery:N/A Details of operation can be found in separate operative note.  Patient had a postpartum course complicated by***.  She is ambulating, tolerating a regular diet, passing flatus, and urinating well. Patient is discharged home in stable condition on  08/26/23        Newborn Data: Birth date:08/26/2023 Birth time:9:42 PM Gender:Female Living status:Living Apgars:8 ,9  Weight:3950 g    Magnesium Sulfate received: {Mag received:30440022} BMZ received: No Rhophylac:N/A MMR:N/A T-DaP:Given prenatally Flu: {ZHY:86578} RSV Vaccine received: {RSV:31013} Transfusion:{Transfusion received:30440034}  Immunizations received: Immunization History  Administered Date(s) Administered   Influenza,inj,Quad PF,6+ Mos 08/08/2019   Tdap 10/21/2020    Physical exam  Vitals:   08/26/23 1910 08/26/23 1912 08/26/23 2235  BP: 125/70  (!) 100/49  Pulse: 83  84  Resp: 20  16  Temp: 98.1 F (36.7 C)    TempSrc:  Oral    SpO2: 100% 100% 99%  Weight: 72.1 kg     General: {Exam; general:21111117} Lochia: {Desc; appropriate/inappropriate:30686::"appropriate"} Uterine Fundus: {Desc; firm/soft:30687} Incision: {Exam; incision:21111123} DVT Evaluation: {Exam; dvt:2111122} Labs: Lab Results  Component Value Date   WBC 6.4 08/26/2023   HGB 10.5 (L) 08/26/2023   HCT 33.0 (L) 08/26/2023   MCV 80.9 08/26/2023   PLT 244 08/26/2023      Latest Ref Rng & Units 12/13/2020    5:22 AM  CMP  Creatinine 0.44 - 1.00 mg/dL 4.69    Edinburgh Score:    12/13/2020   12:25 AM  Edinburgh Postnatal Depression Scale Screening Tool  I have been able to laugh and see the funny side of things. 0  I have looked forward with enjoyment to things. 0  I have blamed myself unnecessarily when things went wrong. 2  I have been anxious or worried for no good reason. 0  I have felt scared or panicky for no good reason. 0  Things have been getting on top of me. 0  I have been so unhappy that I have had difficulty sleeping. 0  I have felt sad or miserable. 0  I have been so unhappy that I have been crying. 0  The thought of harming myself has occurred to me. 0  Edinburgh Postnatal Depression Scale Total 2   No data recorded  After visit meds:  Allergies as of 08/26/2023   No Known Allergies   Med Rec must be completed prior to using this [facility]***  Discharge home in stable condition Infant Feeding: {Baby feeding:23562} Infant Disposition:{CHL IP OB HOME WITH WJXBJY:78295} Discharge instruction: per After Visit Summary and Postpartum booklet. Activity: Advance as tolerated. Pelvic rest for 6 weeks.  Diet: {OB AOZH:08657846} Future Appointments: Future Appointments  Date Time Provider Department Center  08/31/2023  9:00 AM [facility] PAT 1 [facility] None   Follow up Visit: Message sent to [facility] 10/12  Please schedule this patient for a In person postpartum visit in 4 weeks with the following provider:  Any provider. Additional Postpartum F/U:Incision check 1 week  High risk pregnancy complicated by:  Anemia, LGA, hx of molar pregnancy, hx or PPROM, hx of 2 prior CS, obesity  Delivery mode:  C-Section, Low Transverse Anticipated Birth Control:   Depo > LARC   08/26/2023 Hessie Dibble, MD

## 2023-08-27 ENCOUNTER — Encounter (HOSPITAL_COMMUNITY): Payer: Self-pay | Admitting: Obstetrics and Gynecology

## 2023-08-27 LAB — CBC
HCT: 21.5 % — ABNORMAL LOW (ref 36.0–46.0)
HCT: 19.6 % — ABNORMAL LOW (ref 36.0–46.0)
Hemoglobin: 6.9 g/dL — CL (ref 12.0–15.0)
Hemoglobin: 6.4 g/dL — CL (ref 12.0–15.0)
MCH: 26.1 pg (ref 26.0–34.0)
MCH: 26.2 pg (ref 26.0–34.0)
MCHC: 32.1 g/dL (ref 30.0–36.0)
MCHC: 32.7 g/dL (ref 30.0–36.0)
MCV: 81.4 fL (ref 80.0–100.0)
MCV: 80.3 fL (ref 80.0–100.0)
Platelets: 209 10*3/uL (ref 150–400)
Platelets: 192 10*3/uL (ref 150–400)
RBC: 2.64 MIL/uL — ABNORMAL LOW (ref 3.87–5.11)
RBC: 2.44 MIL/uL — ABNORMAL LOW (ref 3.87–5.11)
RDW: 15.3 % (ref 11.5–15.5)
RDW: 15.3 % (ref 11.5–15.5)
WBC: 16 10*3/uL — ABNORMAL HIGH (ref 4.0–10.5)
WBC: 15.1 10*3/uL — ABNORMAL HIGH (ref 4.0–10.5)
nRBC: 0 % (ref 0.0–0.2)
nRBC: 0 % (ref 0.0–0.2)

## 2023-08-27 LAB — RPR: RPR Ser Ql: NONREACTIVE

## 2023-08-27 LAB — CREATININE, SERUM
Creatinine, Ser: 0.48 mg/dL (ref 0.44–1.00)
GFR, Estimated: >60 mL/min (ref >60)

## 2023-08-27 LAB — PREPARE RBC (CROSSMATCH)

## 2023-08-27 LAB — HEMOGLOBIN AND HEMATOCRIT, BLOOD
HCT: 23.7 % — ABNORMAL LOW (ref 36.0–46.0)
Hemoglobin: 8 g/dL — ABNORMAL LOW (ref 12.0–15.0)

## 2023-08-27 MED ORDER — KETOROLAC TROMETHAMINE 30 MG/ML IJ SOLN
30.0000 mg | Freq: Four times a day (QID) | INTRAMUSCULAR | Status: AC
  Administered 2023-08-27 – 2023-08-28 (×4): 30 mg via INTRAVENOUS
  Filled 2023-08-27 (×4): qty 1

## 2023-08-27 MED ORDER — MAGNESIUM HYDROXIDE 400 MG/5ML PO SUSP: 30.0000 mL | ORAL | Status: DC | PRN

## 2023-08-27 MED ORDER — COCONUT OIL OIL: 1.0000 | TOPICAL_OIL | Status: DC | PRN

## 2023-08-27 MED ORDER — SIMETHICONE 80 MG PO CHEW: 80.0000 mg | CHEWABLE_TABLET | ORAL | Status: DC | PRN

## 2023-08-27 MED ORDER — ZOLPIDEM TARTRATE 5 MG PO TABS: 5.0000 mg | ORAL_TABLET | Freq: Every evening | ORAL | Status: DC | PRN

## 2023-08-27 MED ORDER — ACETAMINOPHEN 500 MG PO TABS
1000.0000 mg | ORAL_TABLET | Freq: Four times a day (QID) | ORAL | Status: DC
  Administered 2023-08-27 – 2023-08-29 (×10): 1000 mg via ORAL
  Filled 2023-08-27 (×11): qty 2

## 2023-08-27 MED ORDER — MENTHOL 3 MG MT LOZG: 1.0000 | LOZENGE | OROMUCOSAL | Status: DC | PRN

## 2023-08-27 MED ORDER — DIPHENHYDRAMINE HCL 25 MG PO CAPS: 25.0000 mg | ORAL_CAPSULE | Freq: Four times a day (QID) | ORAL | Status: DC | PRN

## 2023-08-27 MED ORDER — SIMETHICONE 80 MG PO CHEW
80.0000 mg | CHEWABLE_TABLET | Freq: Three times a day (TID) | ORAL | Status: DC
  Administered 2023-08-27 – 2023-08-29 (×6): 80 mg via ORAL
  Filled 2023-08-27 (×6): qty 1

## 2023-08-27 MED ORDER — GABAPENTIN 100 MG PO CAPS
200.0000 mg | ORAL_CAPSULE | Freq: Every day | ORAL | Status: DC
  Administered 2023-08-27 – 2023-08-28 (×2): 200 mg via ORAL
  Filled 2023-08-27 (×2): qty 2

## 2023-08-27 MED ORDER — ENOXAPARIN SODIUM 40 MG/0.4ML IJ SOSY
40.0000 mg | PREFILLED_SYRINGE | INTRAMUSCULAR | Status: DC
  Administered 2023-08-27 – 2023-08-29 (×3): 40 mg via SUBCUTANEOUS
  Filled 2023-08-27 (×3): qty 0.4

## 2023-08-27 MED ORDER — MEDROXYPROGESTERONE ACETATE 150 MG/ML IM SUSP: 150.0000 mg | INTRAMUSCULAR | Status: DC | PRN

## 2023-08-27 MED ORDER — OXYCODONE HCL 5 MG PO TABS: 5.0000 mg | ORAL_TABLET | ORAL | Status: DC | PRN

## 2023-08-27 MED ORDER — SODIUM CHLORIDE 0.9% IV SOLUTION: Freq: Once | INTRAVENOUS | Status: AC

## 2023-08-27 MED ORDER — IBUPROFEN 600 MG PO TABS
600.0000 mg | ORAL_TABLET | Freq: Four times a day (QID) | ORAL | Status: DC
  Administered 2023-08-28 – 2023-08-29 (×6): 600 mg via ORAL
  Filled 2023-08-27 (×6): qty 1

## 2023-08-27 MED ORDER — LACTATED RINGERS IV SOLN: INTRAVENOUS | Status: AC

## 2023-08-27 MED ORDER — SENNOSIDES-DOCUSATE SODIUM 8.6-50 MG PO TABS
2.0000 | ORAL_TABLET | Freq: Every day | ORAL | Status: DC
  Administered 2023-08-27 – 2023-08-29 (×3): 2 via ORAL
  Filled 2023-08-27 (×3): qty 2

## 2023-08-27 MED ORDER — OXYTOCIN-SODIUM CHLORIDE 30-0.9 UT/500ML-% IV SOLN: 2.5000 [IU]/h | INTRAVENOUS | Status: AC

## 2023-08-27 MED ORDER — LACTATED RINGERS IV BOLUS
1000.0000 mL | Freq: Once | INTRAVENOUS | Status: AC
  Administered 2023-08-27: 1000 mL via INTRAVENOUS

## 2023-08-27 MED ORDER — PRENATAL MULTIVITAMIN CH
1.0000 | ORAL_TABLET | Freq: Every day | ORAL | Status: DC
  Administered 2023-08-27 – 2023-08-29 (×3): 1 via ORAL
  Filled 2023-08-27 (×3): qty 1

## 2023-08-27 MED ORDER — WITCH HAZEL-GLYCERIN EX PADS: 1.0000 | MEDICATED_PAD | CUTANEOUS | Status: DC | PRN

## 2023-08-27 MED ORDER — DIBUCAINE (PERIANAL) 1 % EX OINT: 1.0000 | TOPICAL_OINTMENT | CUTANEOUS | Status: DC | PRN

## 2023-08-27 NOTE — Lactation Note (Signed)
This note was copied from a baby's chart. Lactation Consultation Note  Patient Name: Nichole Soto Date: 08/27/2023 Age:32 hours Reason for consult: Initial assessment;Early term 37-38.6wks  Spanish interpreter present Graciela. P3, Mother states she would like to breastfeed and formula feed.  She is experienced with breastfeeding.  Encouraged her to offer both breasts first before offering formula to help establish her milk supply.  Suggest calling for help with latching as needed.  Feed on demand with cues.  Goal 8-12+ times per day after first 24 hrs.  Unwrap baby to wake for feeding if needed.  Mom made aware of O/P services, breastfeeding support group, and our phone # for post-discharge questions in Spanish.    Maternal Data Has patient been taught Hand Expression?: Yes Does the patient have breastfeeding experience prior to this delivery?: Yes How long did the patient breastfeed?: 1.5 years & 6 mos. (Breast and formula fed both children)  Feeding Mother's Current Feeding Choice: Breast Milk and Formula  Interventions Interventions: Breast feeding basics reviewed;Education  Consult Status Consult Status: Follow-up Date: 08/28/23 Follow-up type: In-patient    Hardie Pulley  RN IBCLC 08/27/2023, 8:41 AM

## 2023-08-27 NOTE — Progress Notes (Addendum)
POSTPARTUM PROGRESS NOTE  Subjective: Nichole Soto is a 32 y.o. [phone] s/p LTCS at [redacted]w[redacted]d.  She reports she is doing well. No acute events overnight. She denies any problems with ambulating, voiding or po intake. Denies nausea or vomiting. She has not passed flatus. She has not had bowel movement. Pain is well controlled. Lochia is Moderate. Denies dizziness, dyspnea, and chest palpitation.  Patient has not been out of bed since procedure, but states she is feeling well.  Has been breastfeeding successfully.  States she has only had water and orange juice is not hungry, agrees to eat some food when encouraged.  Reports 3 pads changed since yesterday, moderately soaked.  Objective: BP (!) 88/66 (BP Location: Left Arm)   Pulse 93   Temp 98 F (36.7 C) (Oral)   Resp 16   Wt 72.1 kg   LMP 12/03/2022   SpO2 100%   Breastfeeding Unknown   BMI 34.41 kg/m   Physical Exam:  General: alert, cooperative and no distress Chest: no respiratory distress, normal WOB on room air Abdomen: soft, non-tender  incision well approximated with dry honeycomb dressing, no seeping or edema Uterine Fundus: firm, appropriately tender Extremities: No calf swelling or tenderness  no LE edema  capillary refill <2 seconds  Recent Labs    08/26/23 1930 08/27/23 0547  HGB 10.5* 6.9*  HCT 33.0* 21.5*    Assessment/Plan: Nichole Soto is a 32 y.o. [phone] s/p LTCS at [redacted]w[redacted]d for LGA in s/o H/o 2 prior CS (breech).  POD#1: Doing well, pain well-controlled. -- Routine postpartum care, lactation support -- Encouraged up OOB -- Lovenox for VTE prophylaxis -- Contraception: Depo-Provera -- Feeding: breast feeding and bottle feeding -- Circumcision: N/A  -- Anemia: EBL 869.  Hgb 10.6>6.9, patient states she is asymptomatic.  EBL would not account for this drop in hemoglobin, patient has only had moderate bleeding since operation.  Awaiting repeat STAT CBC to determine if  PRBC is needed.  PRBC on hold.  Given soft BP (MAP 73), 1L LR bolus ordered.  Dispo: Plan for discharge within 2 days.  Dimitry Shitarev [facility] PGY-1 08/27/23 7:44 AM

## 2023-08-27 NOTE — Progress Notes (Signed)
POD1 s/p RLTCS  Subjective: no complaints, up ad lib, voiding, tolerating PO, and + flatus. Denies dizziness/sob/CP.   Objective: Blood pressure (!) 100/48, pulse 78, temperature 98 F (36.7 C), temperature source Oral, resp. rate 18, weight 72.1 kg, last menstrual period 12/03/2022, SpO2 99%, unknown if currently breastfeeding.  Physical Exam:  General: alert, cooperative, and no distress Lochia: appropriate Uterine Fundus: firm Incision: healing well, no significant drainage DVT Evaluation: No evidence of DVT seen on physical exam.  Recent Labs    08/27/23 0547 08/27/23 0744  HGB 6.9* 6.4*  HCT 21.5* 19.6*    Assessment/Plan: Postpartum - Contraception: Depo  - MOF: Breast - Rh status: pos - Rubella status: immune - Dispo: Anticipate - Consults: Lactation.   Neonatal - Doing well  3. Language Barrier - Interpreter [phone] used throughout our visit.   4. 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 Acute blood loss anemia - Recheck HgB was 6.4. Patient transfused 2u. Recheck CBC in am.    LOS: 1 day   Milas Hock 08/27/2023, 4:26 PM

## 2023-08-27 NOTE — Progress Notes (Signed)
Patient notes numbness and tingling in her left arm.

## 2023-08-27 NOTE — Progress Notes (Signed)
Attempted orthostatic BP with pt becoming dizzy upon sitting and BP drop from 95/54 lying to 85/48 upon sitting. Did not attempt standing at this time.

## 2023-08-28 ENCOUNTER — Encounter (HOSPITAL_COMMUNITY): Payer: Self-pay | Admitting: Family Medicine

## 2023-08-28 LAB — BPAM RBC
Blood Product Expiration Date: 202411052359
Blood Product Expiration Date: 202411072359
ISSUE DATE / TIME: 202410130905
ISSUE DATE / TIME: 202410131141
Unit Type and Rh: 5100
Unit Type and Rh: 5100

## 2023-08-28 LAB — CBC
HCT: 23.1 % — ABNORMAL LOW (ref 36.0–46.0)
Hemoglobin: 7.7 g/dL — ABNORMAL LOW (ref 12.0–15.0)
MCH: 27.2 pg (ref 26.0–34.0)
MCHC: 33.3 g/dL (ref 30.0–36.0)
MCV: 81.6 fL (ref 80.0–100.0)
Platelets: 178 10*3/uL (ref 150–400)
RBC: 2.83 MIL/uL — ABNORMAL LOW (ref 3.87–5.11)
RDW: 15.3 % (ref 11.5–15.5)
WBC: 9.3 10*3/uL (ref 4.0–10.5)
nRBC: 0.3 % — ABNORMAL HIGH (ref 0.0–0.2)

## 2023-08-28 LAB — TYPE AND SCREEN
ABO/RH(D): O POS
Antibody Screen: NEGATIVE
Unit division: 0
Unit division: 0

## 2023-08-28 MED ORDER — FERROUS SULFATE 325 (65 FE) MG PO TABS
325.0000 mg | ORAL_TABLET | Freq: Every day | ORAL | Status: DC
  Administered 2023-08-28 – 2023-08-29 (×2): 325 mg via ORAL
  Filled 2023-08-28 (×2): qty 1

## 2023-08-28 NOTE — Progress Notes (Addendum)
POSTPARTUM PROGRESS NOTE  Spanish Interpreter via phone services used for duration of evaluation.  Subjective: Nichole Soto is a 32 y.o. [phone] s/p LTCS at [redacted]w[redacted]d.  She reports she is doing well. No acute events overnight. She denies any problems with ambulating, voiding or po intake. Denies nausea or vomiting. She has passed flatus. She has not had bowel movement. Pain is well controlled. Lochia is Minimal.  Denies dizziness, dyspnea, and chest palpitations.  Objective: BP 92/62 (BP Location: Right Arm)   Pulse 75   Temp 97.7 F (36.5 C) (Oral)   Resp 18   Wt 72.1 kg   LMP 12/03/2022   SpO2 100%   Breastfeeding Unknown   BMI 34.41 kg/m   Physical Exam:  General: alert, cooperative and no distress Chest: no respiratory distress Abdomen: soft, non-tender  incision well approximated with minimal blood on honeycomb Uterine Fundus: firm, appropriately tender, below level of umbilicus Extremities: No calf swelling or tenderness  no LE edema  Recent Labs    08/27/23 1707 08/28/23 0445  HGB 8.0* 7.7*  HCT 23.7* 23.1*    Assessment/Plan: Nichole Soto is a 32 y.o. [phone] s/p LTCS at [redacted]w[redacted]d for LGA in s/o H/o 2 prior CS (breech).  POD#2: Doing well, pain well-controlled. H/H appropriate. -- Routine postpartum care, lactation support -- Encouraged up OOB -- Contraception: Depo-Provera -- Feeding: breast feeding and formula feeding -- Circumcision: N/A  -- Acute blood loss anemia: Hgb 6.4>7.7 today s/p 2u PRBC and asymptomatic.  BPs stable, appear to be lower at baseline.  Good improvement. Started on PO iron today. 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  Dispo: Plan for discharge tomorrow.  Dimitry Shitarev [facility] PGY-1 08/28/23 5:53 AM  Attestation of Supervision of Resident:  I confirm that I have verified the information documented in the resident's note and that I have also personally reperformed the history,  physical exam and all medical decision making activities.  I have verified that all services and findings are accurately documented in this resident's note; and I agree with management and plan as outlined in the documentation. I have also made any necessary editorial changes.   Sundra Aland, MD OB Fellow, [facility], [facility]

## 2023-08-28 NOTE — Lactation Note (Signed)
This note was copied from a baby's chart. Lactation Consultation Note  Patient Name: Nichole Soto [id number] Date: 08/28/2023 Age:32 hours Reason for consult: Follow-up assessment;Early term 73-38.6wks Telephone Interpreter used for communication in Spanish: Blossom Hoops  P3- MOB has been placing infant to the breast some, but has mostly been formula feeding. MOB states that she has no milk right now, so she plans to switch completely to breast when her mature milk comes in. LC reviewed the difference between mature milk and colostrum. LC encouraged MOB to place infant to the breast first and then offer formula after so that her breasts get the stimulation it needs. MOB verbalizes agreement. LC encouraged MOB to call for further assistance as needed.  Feeding Mother's Current Feeding Choice: Breast Milk and Formula Nipple Type: Slow - flow  Lactation Tools Discussed/Used Pump Education: Milk Storage  Interventions Interventions: Breast feeding basics reviewed;Education;Pace feeding;LC Services brochure  Discharge Discharge Education: Warning signs for feeding baby  Consult Status Consult Status: Follow-up Date: 08/29/23 Follow-up type: In-patient    Dema Severin BS, IBCLC 08/28/2023, 4:37 PM

## 2023-08-29 ENCOUNTER — Other Ambulatory Visit: Payer: Self-pay

## 2023-08-29 LAB — SURGICAL PATHOLOGY

## 2023-08-29 MED ORDER — SENNOSIDES-DOCUSATE SODIUM 8.6-50 MG PO TABS: 2.0000 | ORAL_TABLET | Freq: Two times a day (BID) | ORAL | 0 refills | Status: AC | PRN

## 2023-08-29 NOTE — Lactation Note (Signed)
This note was copied from a baby's chart. Lactation Consultation Note  Patient Name: Nichole Soto Date: 08/29/2023 Age:32 hours  Reason for consult: Follow-up assessment;Early term 37-38.6wks  P3, [redacted]w[redacted]d, 4% weight loss  Follow up visit with [facility] Spanish Interpreter, Raquel, to see P3 mother. Mother has been breast and formula feeding by choice. She states she is latching baby before giving formula and will transition to breastfeeding mostly. Mother states she hears baby swallow and the latch is comfortable. She denies any concerns.  Manual breast pump was requested. Instructed mother on the use, frequency, cleaning of breast pump and storage of breast milk. Reviewed engorgement and management.   Mom made aware of O/P services, breastfeeding support groups, community resources, and our phone # for post-discharge questions.         Feeding Mother's Current Feeding Choice: Breast Milk and Formula     Lactation Tools Discussed/Used Tools: Pump Breast pump type: Manual Pump Education: Setup, frequency, and cleaning;Milk Storage;Other (comment) (cleaning breast pump and supplies) Reason for Pumping: for home use, as needed Pumping frequency: as needed  Interventions Interventions: Hand express;Hand pump;Education;CDC Guidelines for Breast Pump Cleaning (handout on breast milk storage and preparation)  Discharge Pump: Manual  Consult Status Consult Status: Complete Date: 08/29/23    Omar Person 08/29/2023, 9:17 AM

## 2023-08-29 NOTE — Plan of Care (Signed)
Problem: Education: Goal: Knowledge of General Education information will improve Description: Including pain rating scale, medication(s)/side effects and non-pharmacologic comfort measures Outcome: Adequate for Discharge   Problem: Health Behavior/Discharge Planning: Goal: Ability to manage health-related needs will improve Outcome: Adequate for Discharge   Problem: Clinical Measurements: Goal: Ability to maintain clinical measurements within normal limits will improve Outcome: Adequate for Discharge Goal: Will remain free from infection Outcome: Adequate for Discharge Goal: Diagnostic test results will improve Outcome: Adequate for Discharge Goal: Respiratory complications will improve Outcome: Adequate for Discharge Goal: Cardiovascular complication will be avoided Outcome: Adequate for Discharge   Problem: Activity: Goal: Risk for activity intolerance will decrease Outcome: Adequate for Discharge   Problem: Nutrition: Goal: Adequate nutrition will be maintained Outcome: Adequate for Discharge   Problem: Coping: Goal: Level of anxiety will decrease Outcome: Adequate for Discharge   Problem: Elimination: Goal: Will not experience complications related to bowel motility Outcome: Adequate for Discharge Goal: Will not experience complications related to urinary retention Outcome: Adequate for Discharge   Problem: Pain Managment: Goal: General experience of comfort will improve Outcome: Adequate for Discharge   Problem: Safety: Goal: Ability to remain free from injury will improve Outcome: Adequate for Discharge   Problem: Skin Integrity: Goal: Risk for impaired skin integrity will decrease Outcome: Adequate for Discharge   Problem: Education: Goal: Knowledge of the prescribed therapeutic regimen will improve Outcome: Adequate for Discharge Goal: Understanding of sexual limitations or changes related to disease process or condition will improve Outcome: Adequate  for Discharge Goal: Individualized Educational Video(s) Outcome: Adequate for Discharge   Problem: Self-Concept: Goal: Communication of feelings regarding changes in body function or appearance will improve Outcome: Adequate for Discharge   Problem: Skin Integrity: Goal: Demonstration of wound healing without infection will improve Outcome: Adequate for Discharge   Problem: Education: Goal: Knowledge of condition will improve Outcome: Adequate for Discharge Goal: Individualized Educational Video(s) Outcome: Adequate for Discharge Goal: Individualized Newborn Educational Video(s) Outcome: Adequate for Discharge   Problem: Activity: Goal: Will verbalize the importance of balancing activity with adequate rest periods Outcome: Adequate for Discharge Goal: Ability to tolerate increased activity will improve Outcome: Adequate for Discharge   Problem: Coping: Goal: Ability to identify and utilize available resources and services will improve Outcome: Adequate for Discharge   Problem: Life Cycle: Goal: Chance of risk for complications during the postpartum period will decrease Outcome: Adequate for Discharge   Problem: Role Relationship: Goal: Ability to demonstrate positive interaction with newborn will improve Outcome: Adequate for Discharge   Problem: Skin Integrity: Goal: Demonstration of wound healing without infection will improve Outcome: Adequate for Discharge

## 2023-08-29 NOTE — Progress Notes (Deleted)
POSTPARTUM PROGRESS NOTE  Spanish phone interpreter ID# [phone] used for encounter.  Subjective: Nichole Soto is a 32 y.o. [phone] s/p LTCS at [redacted]w[redacted]d.  She reports she is doing well. No acute events overnight. She denies any problems with ambulating, voiding or po intake. Denies nausea or vomiting. She has passed flatus. She has had bowel movement. Pain is well controlled. Lochia is Minimal. No headache, vision changes, or RUQ pain. No dizziness, dyspnea, or chest palpitations.  Objective: BP 108/69 (BP Location: Left Arm)   Pulse 71   Temp 98.3 F (36.8 C) (Oral)   Resp 16   Wt 72.1 kg   LMP 12/03/2022   SpO2 100%   Breastfeeding Unknown   BMI 34.41 kg/m   Physical Exam:  General: alert, cooperative and no distress HEENT: no mucosal pallor Chest: no respiratory distress Abdomen: soft, non-tender  incision dry and well approximated without swelling or erythema  Uterine Fundus: firm, appropriately tender, below level of umbilicus Extremities: No calf swelling or tenderness  no peripheral edema  Recent Labs    08/27/23 1707 08/28/23 0445  HGB 8.0* 7.7*  HCT 23.7* 23.1*    Assessment/Plan: Nichole Soto is a 32 y.o. [phone] s/p LTCS at [redacted]w[redacted]d for  LGA in s/o H/o 2 prior CS (breech).  POD#3: Doing well, pain well-controlled. H/H appropriate. -- Routine postpartum care, lactation support -- Encouraged up OOB -- Lovenox for VTE prophylaxis -- Contraception: Depo-Provera -- Feeding: breast feeding and bottle feeding, met with lactation consultant -- Circumcision: N/A, F  -- Acute blood loss anemia: Hgb 6.4>7.7 yesterday and asymptomatic.  BPs stable, low baseline.  Continue oral iron. -- Hx molar pregnancy: Placenta sent to pathology, results still pending.  Dispo: Plan for discharge today.

## 2023-08-31 ENCOUNTER — Encounter (HOSPITAL_COMMUNITY)
Admission: RE | Admit: 2023-08-31 | Discharge: 2023-08-31 | Disposition: A | Discharge status: Home / Self Care | Payer: Self-pay | Source: Ambulatory Visit | Attending: Obstetrics and Gynecology | Admitting: Obstetrics and Gynecology

## 2023-09-02 ENCOUNTER — Inpatient Hospital Stay (HOSPITAL_COMMUNITY): Admit: 2023-09-02 | Payer: Self-pay | Admitting: Obstetrics and Gynecology

## 2023-09-04 ENCOUNTER — Other Ambulatory Visit: Payer: Self-pay

## 2023-09-04 ENCOUNTER — Ambulatory Visit (INDEPENDENT_AMBULATORY_CARE_PROVIDER_SITE_OTHER): Payer: Self-pay | Admitting: *Deleted

## 2023-09-04 VITALS — BP 112/59 | HR 71 | Wt 150.7 lb

## 2023-09-04 DIAGNOSIS — Z4889 Encounter for other specified surgical aftercare: Secondary | ICD-10-CM

## 2023-09-04 NOTE — Progress Notes (Signed)
Here for wound check s/p repeat c/s 08/26/23. Incision CDI  without redness, edema or discharge. Reviewed wound care and postpartum appointment. She voices understanding. Nichole Soto

## 2023-09-26 ENCOUNTER — Ambulatory Visit: Payer: Self-pay | Admitting: Family Medicine
